# Patient Record
Sex: Female | Born: 1970 | Race: White | Hispanic: No | Marital: Married | Smoking: Former smoker
Health system: Southern US, Community
[De-identification: ages and names within clinical notes are randomized; demographics above are authoritative.]

## PROBLEM LIST (undated history)

## (undated) DIAGNOSIS — G43829 Menstrual migraine, not intractable, without status migrainosus: Secondary | ICD-10-CM

## (undated) DIAGNOSIS — R7989 Other specified abnormal findings of blood chemistry: Secondary | ICD-10-CM

## (undated) DIAGNOSIS — N979 Female infertility, unspecified: Secondary | ICD-10-CM

## (undated) DIAGNOSIS — B029 Zoster without complications: Secondary | ICD-10-CM

## (undated) DIAGNOSIS — M8430XA Stress fracture, unspecified site, initial encounter for fracture: Secondary | ICD-10-CM

## (undated) DIAGNOSIS — Q998 Other specified chromosome abnormalities: Secondary | ICD-10-CM

## (undated) DIAGNOSIS — Q9989 Other specified chromosome abnormalities: Secondary | ICD-10-CM

## (undated) HISTORY — DX: Female infertility, unspecified: N97.9

## (undated) HISTORY — DX: Stress fracture, unspecified site, initial encounter for fracture: M84.30XA

## (undated) HISTORY — DX: Other specified abnormal findings of blood chemistry: R79.89

## (undated) HISTORY — DX: Other specified chromosome abnormalities: Q99.8

## (undated) HISTORY — DX: Menstrual migraine, not intractable, without status migrainosus: G43.829

## (undated) HISTORY — DX: Zoster without complications: B02.9

## (undated) HISTORY — DX: Other specified chromosome abnormalities: Q99.89

## (undated) HISTORY — PX: OTHER SURGICAL HISTORY: SHX169

---
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
## 1998-10-02 ENCOUNTER — Encounter: Admission: RE | Admit: 1998-10-02 | Discharge: 1998-12-31 | Payer: Self-pay | Admitting: Family Medicine

## 1999-03-19 ENCOUNTER — Other Ambulatory Visit: Admission: RE | Admit: 1999-03-19 | Discharge: 1999-03-19 | Payer: Self-pay | Admitting: Obstetrics and Gynecology

## 2000-03-20 ENCOUNTER — Other Ambulatory Visit: Admission: RE | Admit: 2000-03-20 | Discharge: 2000-03-20 | Payer: Self-pay | Admitting: Obstetrics and Gynecology

## 2001-03-26 ENCOUNTER — Other Ambulatory Visit: Admission: RE | Admit: 2001-03-26 | Discharge: 2001-03-26 | Payer: Self-pay | Admitting: Obstetrics and Gynecology

## 2005-04-29 ENCOUNTER — Other Ambulatory Visit: Admission: RE | Admit: 2005-04-29 | Discharge: 2005-04-29 | Payer: Self-pay | Admitting: Obstetrics and Gynecology

## 2008-03-14 ENCOUNTER — Encounter (INDEPENDENT_AMBULATORY_CARE_PROVIDER_SITE_OTHER): Payer: Self-pay | Admitting: Obstetrics and Gynecology

## 2008-03-14 ENCOUNTER — Ambulatory Visit (HOSPITAL_COMMUNITY): Admission: RE | Admit: 2008-03-14 | Discharge: 2008-03-14 | Payer: Self-pay | Admitting: Obstetrics and Gynecology

## 2009-05-22 ENCOUNTER — Ambulatory Visit (HOSPITAL_COMMUNITY): Admission: RE | Admit: 2009-05-22 | Discharge: 2009-05-22 | Payer: Self-pay | Admitting: Obstetrics and Gynecology

## 2009-05-26 ENCOUNTER — Ambulatory Visit (HOSPITAL_COMMUNITY): Admission: RE | Admit: 2009-05-26 | Discharge: 2009-05-26 | Payer: Self-pay | Admitting: Obstetrics & Gynecology

## 2009-10-27 ENCOUNTER — Ambulatory Visit (HOSPITAL_COMMUNITY): Admission: RE | Admit: 2009-10-27 | Discharge: 2009-10-27 | Payer: Self-pay | Admitting: Family Medicine

## 2010-04-25 ENCOUNTER — Encounter: Payer: Self-pay | Admitting: Orthopedic Surgery

## 2010-05-12 ENCOUNTER — Other Ambulatory Visit: Payer: Self-pay | Admitting: Dermatology

## 2010-06-23 LAB — CBC
HCT: 36.4 % (ref 36.0–46.0)
MCHC: 34.1 g/dL (ref 30.0–36.0)
Platelets: 218 10*3/uL (ref 150–400)
RBC: 4 MIL/uL (ref 3.87–5.11)
RDW: 12.6 % (ref 11.5–15.5)

## 2010-08-17 NOTE — Op Note (Signed)
NAME:  Sarah Lloyd, Sarah Lloyd          ACCOUNT NO.:  [id number]   MEDICAL RECORD NO.:  [id number]          PATIENT TYPE:  AMB   LOCATION:  SDC                           FACILITY:  [facility]   PHYSICIAN:  Randye Lobo, M.D.   DATE OF BIRTH:  1970/11/22   DATE OF PROCEDURE:  03/14/2008  DATE OF DISCHARGE:                               OPERATIVE REPORT   PREOPERATIVE DIAGNOSIS:  Missed abortion.   POSTOPERATIVE DIAGNOSIS:  Missed abortion.   PROCEDURE:  Dilation and evacuation.   SURGEON:  Randye Lobo, MD   ANESTHESIA:  MAC, paracervical block with 1% lidocaine.   IV FLUIDS:  900 mL Ringer's lactate.   ESTIMATED BLOOD LOSS:  Minimal.   URINE OUTPUT:  175 mL.   COMPLICATIONS:  None.   INDICATION FOR THE PROCEDURE:  The patient is a 40 year old gravida 1  para 39 Caucasian female with the last menstrual period January 10, 2008,  who presents for a dilation and evacuation procedure for missed  abortion.  The patient had an ultrasound on March 05, 2008,  documenting a 6-plus 5-week gestational intrauterine sac with evidence  of a yolk sac and no fetal pole.  There was a small subchorionic  hemorrhage visible.  A repeat ultrasound on March 12, 2008, documented  a gestational sac measuring 8+3 weeks, still with no fetal pole present.  There was also the presence of a 3.1-cm simple right ovarian cyst.  The  patient has been given a diagnosis of a missed abortion and a plan is  now made to proceed with a dilation and evacuation procedure after  risks, benefits, and alternatives are reviewed.   FINDINGS:  Examination under anesthesia revealed an 8-week-size  anteverted uterus, no adnexal masses were noted.   SPECIMENS:  Products of conception were sent to Pathology.   PROCEDURE:  The patient was reidentified in the preoperative hold area.  She received Ancef 1 g IV for antibiotic prophylaxis.   In the operating room, a MAC anesthetic was induced, and the patient was  placed in the  dorsal lithotomy position.  The lower abdomen, vagina, and  perineum were then sterilely prepped and draped, and the urine was  emptied from the bladder by a red rubber catheter.   An examination under the anesthesia was performed.  A speculum was  placed inside the vagina and a single-tooth tenaculum was placed on the  anterior cervical lip.  Paracervical block was performed with a total of  10 mL of 1% lidocaine.   The uterus was sounded to 8 cm.  The cervix was then dilated to a #25  Pratt dilator.  A #8 suction tip curette was then introduced through the  cervical os to the level of the uterine fundus and was withdrawn  slightly.  Proper suction was applied and then the catheter tip was  turned in a clockwise fashion as it was removed from the intrauterine  cavity.  This was repeated an additional two times and a moderate amount  of products of conception were obtained.  A sharp curette was then used  to palpate the uterine cavity and gently  curette.  There was no evidence  of additional products of conception.  The suction tip curette was  introduced into the uterine cavity one final time and again proper  suction applied and the suction tip curette removed any remaining clots  present.   The products of conception were sent to Pathology.  A single-tooth  tenaculum was removed from the anterior cervical lip.  Hemostasis was  satisfactory.  The speculum was removed.   The patient was awakened and cleansed of any remaining Betadine.  She  was escorted to the recovery room in stable and awake condition.  There  were no complications to the procedure.  All needle, instrument, and  sponge counts were correct.      Randye Lobo, M.D.  Electronically Signed     BES/MEDQ  D:  03/14/2008  T:  03/14/2008  Job:  [id number]

## 2010-11-29 ENCOUNTER — Other Ambulatory Visit: Payer: Self-pay | Admitting: Obstetrics and Gynecology

## 2011-01-07 LAB — URINALYSIS, ROUTINE W REFLEX MICROSCOPIC
Bilirubin Urine: NEGATIVE
Glucose, UA: NEGATIVE mg/dL
Specific Gravity, Urine: 1.01 (ref 1.005–1.030)
pH: 7 (ref 5.0–8.0)

## 2011-01-07 LAB — CBC
HCT: 39 % (ref 36.0–46.0)
MCV: 90.7 fL (ref 78.0–100.0)

## 2011-01-07 LAB — URINE MICROSCOPIC-ADD ON

## 2011-04-05 HISTORY — PX: OTHER SURGICAL HISTORY: SHX169

## 2011-06-13 ENCOUNTER — Ambulatory Visit: Payer: BC Managed Care – PPO

## 2011-06-13 ENCOUNTER — Ambulatory Visit (INDEPENDENT_AMBULATORY_CARE_PROVIDER_SITE_OTHER): Payer: BC Managed Care – PPO | Admitting: Family Medicine

## 2011-06-13 VITALS — BP 126/84 | HR 66 | Temp 98.3°F | Resp 18 | Ht 68.0 in | Wt 174.0 lb

## 2011-06-13 DIAGNOSIS — M79673 Pain in unspecified foot: Secondary | ICD-10-CM

## 2011-06-13 DIAGNOSIS — M79609 Pain in unspecified limb: Secondary | ICD-10-CM

## 2011-06-13 NOTE — Patient Instructions (Addendum)
Rest foot. Wear comfortable shoes. If pain persists will reassess.  A radiologist will read the films and if he sees anything differently we will let you know.

## 2011-06-13 NOTE — Progress Notes (Signed)
Subjective: Patient would like off yesterday and she said she put her shoes on in a hurry. After that she felt pain in the lateral aspect of the right foot. He continued through the day she played her round of golf. It still hurts today. No specific injury.  Objective: He is tender in the lateral aspect of the right foot. Range of motion is normal. Pedal pulses are adequate.  Assessment: Foot pain  Plan: Right foot pain, etiology unclear. The x-ray was normal.  [facility] reading (PRIMARY) by  Dr. Alwyn Ren X-ray negative.

## 2011-12-29 ENCOUNTER — Other Ambulatory Visit: Payer: Self-pay | Admitting: Obstetrics and Gynecology

## 2012-05-23 ENCOUNTER — Encounter: Payer: Self-pay | Admitting: Internal Medicine

## 2012-05-23 NOTE — Telephone Encounter (Signed)
ERROR

## 2012-11-26 ENCOUNTER — Encounter: Payer: Self-pay | Admitting: Obstetrics and Gynecology

## 2012-11-26 ENCOUNTER — Ambulatory Visit (INDEPENDENT_AMBULATORY_CARE_PROVIDER_SITE_OTHER): Payer: BC Managed Care – PPO | Admitting: Obstetrics and Gynecology

## 2012-11-26 VITALS — BP 120/70 | HR 62 | Ht 68.5 in | Wt 159.5 lb

## 2012-11-26 DIAGNOSIS — N6310 Unspecified lump in the right breast, unspecified quadrant: Secondary | ICD-10-CM

## 2012-11-26 DIAGNOSIS — Z0000 Encounter for general adult medical examination without abnormal findings: Secondary | ICD-10-CM | POA: Insufficient documentation

## 2012-11-26 DIAGNOSIS — Z30011 Encounter for initial prescription of contraceptive pills: Secondary | ICD-10-CM

## 2012-11-26 DIAGNOSIS — Z01419 Encounter for gynecological examination (general) (routine) without abnormal findings: Secondary | ICD-10-CM | POA: Insufficient documentation

## 2012-11-26 DIAGNOSIS — Z3009 Encounter for other general counseling and advice on contraception: Secondary | ICD-10-CM

## 2012-11-26 DIAGNOSIS — N63 Unspecified lump in unspecified breast: Secondary | ICD-10-CM

## 2012-11-26 LAB — POCT URINALYSIS DIPSTICK
Bilirubin, UA: NEGATIVE
Blood, UA: NEGATIVE
Glucose, UA: NEGATIVE
Leukocytes, UA: NEGATIVE

## 2012-11-26 MED ORDER — NORETHINDRONE 0.35 MG PO TABS: 1.0000 | ORAL_TABLET | Freq: Every day | ORAL | Status: DC

## 2012-11-26 NOTE — Progress Notes (Signed)
Patient ID: Sarah Lloyd, female   DOB: Feb 18, 1971, 42 y.o.   MRN: [id number] GYNECOLOGY VISIT  PCP: Dr. Annell Greening, [facility]  Referring provider:   HPI: 42 y.o.   Married  Caucasian  female   [id number] with Patient's last menstrual period was 11/03/2012.   here for  AEX.  Planning to adopt a baby from [place].   Labs today:    Urine:  Neg  GYNECOLOGIC HISTORY: Patient's last menstrual period was 11/03/2012. Menses:  flow is light, occasional migraine with aura during menses. Bleeding:  Light flow Sexually active:  yes Partner preference: female Contraception:  condoms everytime Hormone therapy: no DES exposure:  denies Blood transfusions:  none Sexually transmitted diseases:  The patient denies history of sexually transmitted disease. Previous GYN Procedures:  egg retrieval for implantation - [facility] Last mammogram:  Never   Last pap:  normal Date: 12/29/11 History of abnormal pap smear:  no   OB History   Grav Para Term Preterm Abortions TAB SAB Ect Mult Living   2    2 2     0       LIFESTYLE: Exercise:   jogging            Tobacco: no Alcohol: 3 glasses of wine per week Drug use:  no  OTHER HEALTH MAINTENANCE: Last tetanus/TDap: greater than 10 years Gardisil: never Last Influenza:  12/2011 through work(receives free) Zostavax:  never  Last bone density: never Last colonoscopy: never  Last cholesterol check: 2012/2013 wnl with PCP  Family History  Problem Relation Age of Onset  . Heart attack Father   . Hypertension Father   . Cancer Maternal Grandfather     colon cancer  . Diabetes Maternal Grandmother     diet controlled  . Thyroid disease Mother     hypothyroid    There are no active problems to display for this patient.   Past Medical History  Diagnosis Date  . Infertility, female   . Migraine, menstrual     Past Surgical History  Procedure Laterality Date  . Appendectomy  1986  . Dilatation and curettage  2010,  2011  . Pregenetic diagnostic  testing  2013  . Ovarian egg retrieval  2013    ALLERGIES: Review of patient's allergies indicates no known allergies.  Current Outpatient Prescriptions  Medication Sig Dispense Refill  . Multiple Vitamin (MULTIVITAMIN) capsule Take 1 capsule by mouth daily.      Marland Kitchen aspirin 81 MG tablet Take 81 mg by mouth daily.      Marland Kitchen dexamethasone (DECADRON) 2 MG tablet Take 2 mg by mouth 2 (two) times daily with a meal.       No current facility-administered medications for this visit.     ROS:  Pertinent items are noted in HPI.  SOCIAL HISTORY:  Married.  Physical therapy assistant.    PHYSICAL EXAMINATION:    BP 120/70  Pulse 62  Ht 5' 8.5" (1.74 m)  Wt 159 lb 8 oz (72.349 kg)  BMI 23.9 kg/m2  LMP 11/03/2012   Wt Readings from Last 3 Encounters:  11/26/12 159 lb 8 oz (72.349 kg)  06/13/11 174 lb (78.926 kg)     Ht Readings from Last 3 Encounters:  11/26/12 5' 8.5" (1.74 m)  06/13/11 5\' 8"  (1.727 m)    General appearance: alert, cooperative and appears stated age Head: Normocephalic, without obvious abnormality, atraumatic Neck: no adenopathy, supple, symmetrical, trachea midline and thyroid not enlarged, symmetric, no tenderness/mass/nodules Lungs:  clear to auscultation bilaterally Breasts: Inspection negative, No nipple retraction or dimpling, No nipple discharge or bleeding, No axillary or supraclavicular adenopathy, Right breast with 4 mm, firm, mobile mass at 12 o'clock, 2 cm above the areolar margin. Normal to palpation without dominant masses on the left breast. Heart: regular rate and rhythm Abdomen: soft, non-tender;  no masses,  no organomegaly Extremities: extremities normal, atraumatic, no cyanosis or edema Skin: Skin color, texture, turgor normal. No rashes or lesions Lymph nodes: Cervical, supraclavicular, and axillary nodes normal. No abnormal inguinal nodes palpated Neurologic: Grossly normal   Pelvic: External genitalia:  no  lesions              Urethra:  normal appearing urethra with no masses, tenderness or lesions              Bartholins and Skenes: normal                 Vagina: normal appearing vagina with normal color and discharge, no lesions              Cervix: normal appearance                  Bimanual Exam:  Uterus:  uterus is normal size, shape, consistency and nontender                                      Adnexa: normal adnexa in size, nontender and no masses                                      Rectovaginal: Confirms                                      Anus:  normal sphincter tone, no lesions  ASSESSMENT  Right breast mass.  PLAN  Mammogram and right breast ultrasound.  If mammogram is normal, can start progesterone only OCPs.  Discussed proper use and risks and benefits. Pap smear and high risk HPV testing in two months. Return in two months for pap and pelvic and recheck on Progesterone only OCPs   An After Visit Summary was printed and given to the patient.

## 2012-11-26 NOTE — Patient Instructions (Addendum)
PLEASE WAIT UNTIL THE MAMMOGRAM IS DONE AND NORMAL BEFORE STARTING THESE BIRTH CONTROL PILLS.  Norethindrone tablets (contraception) What is this medicine? NORETHINDRONE is an oral contraceptive. The product contains a female hormone known as a progestin. It is used to prevent pregnancy. This medicine may be used for other purposes; ask your health care provider or pharmacist if you have questions. What should I tell my health care provider before I take this medicine? They need to know if you have any of these conditions: -blood vessel disease or blood clots -breast, cervical, or vaginal cancer -diabetes -heart disease -kidney disease -liver disease -mental depression -migraine -seizures -stroke -vaginal bleeding -an unusual or allergic reaction to norethindrone, other medicines, foods, dyes, or preservatives -pregnant or trying to get pregnant -breast-feeding How should I use this medicine? Take this medicine by mouth with a glass of water. You may take it with or without food. Follow the directions on the prescription label. Take this medicine at the same time each day and in the order directed on the package. Do not take your medicine more often than directed. Contact your pediatrician regarding the use of this medicine in children. Special care may be needed. This medicine has been used in female children who have started having menstrual periods. A patient package insert for the product will be given with each prescription and refill. Read this sheet carefully each time. The sheet may change frequently. Overdosage: If you think you have taken too much of this medicine contact a poison control center or emergency room at once. NOTE: This medicine is only for you. Do not share this medicine with others. What if I miss a dose? Try not to miss a dose. Every time you miss a dose or take a dose late your chance of pregnancy increases. When 1 pill is missed (even if only 3 hours late),  take the missed pill as soon as possible and continue taking a pill each day at the regular time (use a back up method of birth control for the next 48 hours). If more than 1 dose is missed, use an additional birth control method for the rest of your pill pack until menses occurs. Contact your health care professional if more than 1 dose has been missed. What may interact with this medicine? Do not take this medicine with any of the following medications: -amprenavir or fosamprenavir -bosentan This medicine may also interact with the following medications: -antibiotics or medicines for infections, especially rifampin, rifabutin, rifapentine, and griseofulvin, and possibly penicillins or tetracyclines -aprepitant -barbiturate medicines, such as phenobarbital -carbamazepine -felbamate -modafinil -oxcarbazepine -phenytoin -ritonavir or other medicines for HIV infection or AIDS -St. John's wort -topiramate This list may not describe all possible interactions. Give your health care provider a list of all the medicines, herbs, non-prescription drugs, or dietary supplements you use. Also tell them if you smoke, drink alcohol, or use illegal drugs. Some items may interact with your medicine. What should I watch for while using this medicine? Visit your doctor or health care professional for regular checks on your progress. You will need a regular breast and pelvic exam and Pap smear while on this medicine. Use an additional method of birth control during the first cycle that you take these tablets. If you have any reason to think you are pregnant, stop taking this medicine right away and contact your doctor or health care professional. If you are taking this medicine for hormone related problems, it may take several cycles of use  to see improvement in your condition. This medicine does not protect you against HIV infection (AIDS) or any other sexually transmitted diseases. What side effects may I  notice from receiving this medicine? Side effects that you should report to your doctor or health care professional as soon as possible: -breast tenderness or discharge -pain in the abdomen, chest, groin or leg -severe headache -skin rash, itching, or hives -sudden shortness of breath -unusually weak or tired -vision or speech problems -yellowing of skin or eyes Side effects that usually do not require medical attention (report to your doctor or health care professional if they continue or are bothersome): -changes in sexual desire -change in menstrual flow -facial hair growth -fluid retention and swelling -headache -irritability -nausea -weight gain or loss This list may not describe all possible side effects. Call your doctor for medical advice about side effects. You may report side effects to FDA at 1-800-FDA-1088. Where should I keep my medicine? Keep out of the reach of children. Store at room temperature between 15 and 30 degrees C (59 and 86 degrees F). Throw away any unused medicine after the expiration date. NOTE: This sheet is a summary. It may not cover all possible information. If you have questions about this medicine, talk to your doctor, pharmacist, or health care provider.  2012, [employer] Standard. (03/06/2008 1:54:05 PM)

## 2012-11-29 ENCOUNTER — Encounter: Payer: Self-pay | Admitting: Obstetrics and Gynecology

## 2012-12-06 ENCOUNTER — Ambulatory Visit
Admission: RE | Admit: 2012-12-06 | Discharge: 2012-12-06 | Disposition: A | Discharge status: Home / Self Care | Payer: BC Managed Care – PPO | Source: Ambulatory Visit | Attending: Obstetrics and Gynecology | Admitting: Obstetrics and Gynecology

## 2012-12-06 DIAGNOSIS — N6310 Unspecified lump in the right breast, unspecified quadrant: Secondary | ICD-10-CM

## 2012-12-11 ENCOUNTER — Encounter: Payer: Self-pay | Admitting: Obstetrics and Gynecology

## 2012-12-13 ENCOUNTER — Other Ambulatory Visit: Payer: Self-pay

## 2013-01-21 ENCOUNTER — Telehealth: Payer: Self-pay | Admitting: Obstetrics and Gynecology

## 2013-01-21 ENCOUNTER — Ambulatory Visit: Payer: Self-pay | Admitting: Obstetrics and Gynecology

## 2013-01-21 NOTE — Telephone Encounter (Signed)
Pt started her pills on the wrong day and wondering if she should stop taking them right now.

## 2013-01-21 NOTE — Telephone Encounter (Signed)
Patient is on progesterone only OCP.  They are the same dosage of norethindrone every day.    If patient skipped a pill at any point, she is not protected against pregnancy until she takes them consecutively for a month.    No worries if the patient just started on the wrong day in the pack.  The pills are all the same!  Thanks,  [employer]

## 2013-01-21 NOTE — Telephone Encounter (Signed)
Spoke with pt who is on POP. Pt states she took her pills correctly last month. This month, she started her cycle on 01-19-13 (Sat.) but didn't use the sticker to change the days across the top of the pack. Pt thinks she started the pack mid week. Pt is in grocery store and will call me back when she is at home and can look at the pill pack.

## 2013-01-21 NOTE — Telephone Encounter (Signed)
Pt called back to report she finished her first pack on Thursday the 16th, and started the second pack the very next day on Friday the 17th. Pt did not begin that pack with the first pill in the pack however, she took the first one labeled "Friday", which was the 6th pill in the pack. Pt advises that the pills are the same color, but they have different numbers above them, as though they could be slightly different dosages per pill. Pt isn't sure what to do next. Can she go back and take the first 5 pills, one each day, to "catch up" to where she should be in the pack? Or should she start a new pack?

## 2013-01-21 NOTE — Telephone Encounter (Signed)
Spoke with pt to advise the pills are all the same dosage, so she can take the first pills in the pack, one each day, to get caught up, and then continue the rest of the pack. Pt has not missed or skipped any pills. Pt appreciative.

## 2013-01-24 ENCOUNTER — Other Ambulatory Visit: Payer: Self-pay | Admitting: Dermatology

## 2013-02-04 ENCOUNTER — Encounter: Payer: Self-pay | Admitting: Obstetrics and Gynecology

## 2013-02-04 ENCOUNTER — Ambulatory Visit (INDEPENDENT_AMBULATORY_CARE_PROVIDER_SITE_OTHER): Payer: BC Managed Care – PPO | Admitting: Obstetrics and Gynecology

## 2013-02-04 VITALS — BP 110/80 | HR 60 | Ht 68.5 in | Wt 156.0 lb

## 2013-02-04 DIAGNOSIS — Z124 Encounter for screening for malignant neoplasm of cervix: Secondary | ICD-10-CM

## 2013-02-04 DIAGNOSIS — N926 Irregular menstruation, unspecified: Secondary | ICD-10-CM

## 2013-02-04 DIAGNOSIS — N6310 Unspecified lump in the right breast, unspecified quadrant: Secondary | ICD-10-CM

## 2013-02-04 DIAGNOSIS — N63 Unspecified lump in unspecified breast: Secondary | ICD-10-CM

## 2013-02-04 NOTE — Progress Notes (Signed)
Patient ID: Sarah Lloyd, female   DOB: 1970-09-02, 42 y.o.   MRN: [id number]  Subjective  LMP 01/19/13. Patient bleeding irregularly on Ortho Micronor.  On for 2 months now.  Bled 10/18 - 10/21, then brown spotting, and red again today. Taking on time.  Not sexually active currently.  Started Micronor for contraception for history of migraine with aura during menses.   Had a bilateral mammogram and ultrasound for the right breast lump and had a normal evaluation.  Patient no longer palpates the right breast lump.  No pap or pelvic done with annual exam as patient was on her cycle.    Objective  Breast exam - no dominant masses, retractions, nipple discharge, or axillary adenopathy.   Pelvic - normal external genitalia and urethra.  Cervix and vagina no lesions.  Uterus small and nontender.  No adnexal masses.   Pap and high risk HPV taken.   Assessment  Irregular bleeding on progesterone only OCPs. Not sexually active.  Right breast lump resolved and normal diagnostic evaluation.  Plan  Continue with Micronor for one more month.  If irregular bleeding persists, will re-evaluate.   Continue with monthly self breast exams. Pap and high risk HPV testing done today.  Annual exam in one year.  Mammogram in one year.   15 minute visit with over 50% spent in counseling.   After visit summary to patient.

## 2013-02-04 NOTE — Patient Instructions (Signed)
Please call if your irregular menses continues during your next pack of progesterone birth control pills.

## 2013-02-05 NOTE — Addendum Note (Signed)
Addended by: Conley Simmonds on: 02/05/2013 01:02 PM   Modules accepted: Orders

## 2013-02-07 ENCOUNTER — Other Ambulatory Visit: Payer: Self-pay

## 2013-07-09 ENCOUNTER — Ambulatory Visit: Payer: BC Managed Care – PPO | Admitting: Professional

## 2013-07-16 ENCOUNTER — Ambulatory Visit (INDEPENDENT_AMBULATORY_CARE_PROVIDER_SITE_OTHER): Payer: BC Managed Care – PPO | Admitting: Professional

## 2013-07-16 DIAGNOSIS — Z0289 Encounter for other administrative examinations: Secondary | ICD-10-CM

## 2013-07-30 ENCOUNTER — Ambulatory Visit (INDEPENDENT_AMBULATORY_CARE_PROVIDER_SITE_OTHER): Payer: BLUE CROSS/BLUE SHIELD | Admitting: Professional

## 2013-07-30 DIAGNOSIS — Z0289 Encounter for other administrative examinations: Secondary | ICD-10-CM

## 2013-11-06 ENCOUNTER — Other Ambulatory Visit: Payer: Self-pay

## 2013-11-06 DIAGNOSIS — Z1231 Encounter for screening mammogram for malignant neoplasm of breast: Secondary | ICD-10-CM

## 2013-11-17 ENCOUNTER — Other Ambulatory Visit: Payer: Self-pay | Admitting: Obstetrics and Gynecology

## 2013-11-18 NOTE — Telephone Encounter (Signed)
Patient has AEX scheduled on 02/15/14//kn

## 2013-12-05 ENCOUNTER — Other Ambulatory Visit: Payer: Self-pay | Admitting: Dermatology

## 2013-12-06 ENCOUNTER — Encounter: Payer: Self-pay | Admitting: Obstetrics and Gynecology

## 2013-12-10 ENCOUNTER — Ambulatory Visit
Admission: RE | Admit: 2013-12-10 | Discharge: 2013-12-10 | Disposition: A | Discharge status: Home / Self Care | Payer: BC Managed Care – PPO | Source: Ambulatory Visit

## 2013-12-10 DIAGNOSIS — Z1231 Encounter for screening mammogram for malignant neoplasm of breast: Secondary | ICD-10-CM

## 2014-02-03 ENCOUNTER — Encounter: Payer: Self-pay | Admitting: Obstetrics and Gynecology

## 2014-02-05 ENCOUNTER — Ambulatory Visit (INDEPENDENT_AMBULATORY_CARE_PROVIDER_SITE_OTHER): Payer: BC Managed Care – PPO | Admitting: Obstetrics and Gynecology

## 2014-02-05 ENCOUNTER — Ambulatory Visit: Payer: BC Managed Care – PPO | Admitting: Obstetrics and Gynecology

## 2014-02-05 ENCOUNTER — Encounter: Payer: Self-pay | Admitting: Obstetrics and Gynecology

## 2014-02-05 VITALS — BP 120/80 | HR 66 | Resp 16 | Ht 68.0 in | Wt 177.6 lb

## 2014-02-05 DIAGNOSIS — Z01419 Encounter for gynecological examination (general) (routine) without abnormal findings: Secondary | ICD-10-CM

## 2014-02-05 NOTE — Progress Notes (Signed)
43 y.o. [id number] MarriedCaucasianF here for annual exam.   Cycles irregular for the last few months.  In August had 2 cycles per month:  August 1 and then August 26. September 7 and 18.  October 8 and 24.  Took a couple of late pills but did not result in irregular bleeding at the time.  Still had irregular cycles when she did not miss pills.   Sarah Lloyd reduces migraines.  Patient wants to avoid estrogen.  Not sexually active since dilation and curettage several years ago. History of chromosomal translocation and recurrent miscarriage.  Husband is worried about pregnancy and another miscarriage occurring.  States that she and husband are OK with no sexual activity.   Planning on adoption.   Patient's last menstrual period was 01/25/2014.          Sexually active: Yes.    The current method of family planning is Micronor. Exercising: Yes.    Home exercise routine includes jogging about 3 or 4 times a week. Smoker:  no  Health Maintenance: Pap:  02/05/13 HR HPV neg History of abnormal Pap:  no MMG:  12/10/13 Bi-Rads Neg  Colonoscopy:  never BMD:   never TDaP:  07/2013 through pcp Screening Labs: not today, Hb today: not today, Urine today: neg    reports that she has quit smoking. She does not have any smokeless tobacco history on file. She reports that she drinks alcohol. She reports that she does not use illicit drugs.  Past Medical History  Diagnosis Date  . Infertility, female   . Migraine, menstrual   . 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 Appendectomy  1986  . Dilatation and curettage  2010, 2011  . Pregenetic diagnostic  testing  2013  . Ovarian egg retrieval  2013    Current Outpatient Prescriptions  Medication Sig Dispense Refill  . CAMILA 0.35 MG tablet TAKE 1 TABLET EVERY DAY 28 tablet 2  . Multiple Vitamin (MULTIVITAMIN) capsule Take 1 capsule by mouth daily.     No  current facility-administered medications for this visit.    Family History  Problem Relation Age of Onset  . Heart attack Father   . Hypertension Father   . Cancer Maternal Grandfather     colon cancer  . Diabetes Maternal Grandmother     diet controlled  . Thyroid disease Mother     hypothyroid    ROS:  Pertinent items are noted in HPI.  Otherwise, a comprehensive ROS was negative.  Exam:   BP 120/80 mmHg  Pulse 66  Resp 16  Ht 5\' 8"  (1.727 m)  Wt 177 lb 9.6 oz (80.559 kg)  BMI 27.01 kg/m2  LMP 01/25/2014     Height: 5\' 8"  (172.7 cm)  Ht Readings from Last 3 Encounters:  02/05/14 5\' 8"  (1.727 m)  02/04/13 5' 8.5" (1.74 m)  11/26/12 5' 8.5" (1.74 m)    General appearance: alert, cooperative and appears stated age Head: Normocephalic, without obvious abnormality, atraumatic Neck: no adenopathy, supple, symmetrical, trachea midline and thyroid normal to inspection and palpation Lungs: clear to auscultation bilaterally Breasts: normal appearance, no masses or tenderness, Inspection negative, No nipple retraction or dimpling, No nipple discharge or bleeding No axillary nodes palpable. Heart: regular rate and rhythm Abdomen: soft, non-tender; bowel sounds normal; no masses,  no organomegaly Extremities: extremities normal, atraumatic, no cyanosis or edema Skin: Skin color, texture, turgor normal. No  rashes or lesions Lymph nodes: Cervical, supraclavicular, and axillary nodes normal. No abnormal inguinal nodes palpated Neurologic: Grossly normal   Pelvic: External genitalia:  no lesions              Urethra:  normal appearing urethra with no masses, tenderness or lesions              Bartholins and Skenes: normal                 Vagina: normal appearing vagina with normal color and discharge, no lesions              Cervix: no lesions              Pap taken: No. Bimanual Exam:  Uterus:  normal and normal size, contour, position, consistency, mobility, non-tender               Adnexa: normal adnexa and no mass, fullness, tenderness               Rectovaginal: Confirms               Anus:  normal sphincter tone, no lesions  A:  Well Woman with normal exam Break through bleeding on Micronor.  Migraine headaches.   P:   Mammogram yearly. pap smear not indicated. Will stop Sarah Lloyd and monitor cycles and headaches.  If headaches recur, will send to [facility].  return annually or prn  An After Visit Summary was printed and given to the patient.

## 2014-02-05 NOTE — Patient Instructions (Signed)

## 2014-02-08 ENCOUNTER — Other Ambulatory Visit: Payer: Self-pay | Admitting: Obstetrics and Gynecology

## 2014-11-19 ENCOUNTER — Other Ambulatory Visit: Payer: Self-pay

## 2014-11-19 DIAGNOSIS — Z1231 Encounter for screening mammogram for malignant neoplasm of breast: Secondary | ICD-10-CM

## 2014-12-31 ENCOUNTER — Ambulatory Visit
Admission: RE | Admit: 2014-12-31 | Discharge: 2014-12-31 | Disposition: A | Discharge status: Home / Self Care | Payer: BLUE CROSS/BLUE SHIELD | Source: Ambulatory Visit

## 2014-12-31 DIAGNOSIS — Z1231 Encounter for screening mammogram for malignant neoplasm of breast: Secondary | ICD-10-CM

## 2015-01-02 ENCOUNTER — Other Ambulatory Visit: Payer: Self-pay | Admitting: Obstetrics and Gynecology

## 2015-01-02 DIAGNOSIS — R928 Other abnormal and inconclusive findings on diagnostic imaging of breast: Secondary | ICD-10-CM

## 2015-01-13 ENCOUNTER — Ambulatory Visit
Admission: RE | Admit: 2015-01-13 | Discharge: 2015-01-13 | Disposition: A | Discharge status: Home / Self Care | Payer: BLUE CROSS/BLUE SHIELD | Source: Ambulatory Visit | Attending: Obstetrics and Gynecology | Admitting: Obstetrics and Gynecology

## 2015-01-13 DIAGNOSIS — R928 Other abnormal and inconclusive findings on diagnostic imaging of breast: Secondary | ICD-10-CM

## 2015-02-13 ENCOUNTER — Encounter: Payer: Self-pay | Admitting: Obstetrics and Gynecology

## 2015-02-13 ENCOUNTER — Ambulatory Visit (INDEPENDENT_AMBULATORY_CARE_PROVIDER_SITE_OTHER): Payer: BLUE CROSS/BLUE SHIELD | Admitting: Obstetrics and Gynecology

## 2015-02-13 VITALS — BP 130/82 | HR 60 | Resp 12 | Ht 68.5 in | Wt 175.6 lb

## 2015-02-13 DIAGNOSIS — Z0000 Encounter for general adult medical examination without abnormal findings: Secondary | ICD-10-CM

## 2015-02-13 DIAGNOSIS — Z01419 Encounter for gynecological examination (general) (routine) without abnormal findings: Secondary | ICD-10-CM

## 2015-02-13 LAB — POCT URINALYSIS DIPSTICK
BILIRUBIN UA: NEGATIVE
GLUCOSE UA: NEGATIVE
Ketones, UA: NEGATIVE
LEUKOCYTES UA: NEGATIVE
NITRITE UA: NEGATIVE
Protein, UA: NEGATIVE
UROBILINOGEN UA: NEGATIVE
pH, UA: 5

## 2015-02-13 NOTE — Patient Instructions (Signed)

## 2015-02-13 NOTE — Progress Notes (Signed)
Patient ID: Sarah Lloyd, female   DOB: 05/04/1970, 44 y.o.   MRN: [id number] 44 y.o. G53P0020 Married Caucasian female here for annual exam.    No more migraines since off [place]. Menses are not painful or heavy.   Enjoys going to the beach.   Adopting a child from [place].   PCP:   London Pepper, MD  Patient's last menstrual period was 02/02/2015 (exact date).          Sexually active: No. female The current method of family planning is none.    Exercising: Yes.    running and crossfit.  Did a 1/2 marathon this year in [place]. Smoker:  no  Health Maintenance: Pap:  02-05-13 Neg:Neg HR HPV History of abnormal Pap:  no MMG:  12-31-14 3D Density Cat.C/BiRads3--see Epic Colonoscopy:  n/a BMD:   n/a  Result  n/a TDaP:  2015 with PCP Screening Labs:  Hb today: PCP, Urine today: Trace RBCs--asymptomatic   reports that she has quit smoking. She does not have any smokeless tobacco history on file. She reports that she drinks about 2.4 oz of alcohol per week. She reports that she does not use illicit drugs.  Past Medical History  Diagnosis Date  . Infertility, female   . Migraine, menstrual   . 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 Appendectomy  1986  . Dilatation and curettage  2010, 2011  . Pregenetic diagnostic  testing  2013  . Ovarian egg retrieval  2013    Current Outpatient Prescriptions  Medication Sig Dispense Refill  . Multiple Vitamin (MULTIVITAMIN) capsule Take 1 capsule by mouth daily.     No current facility-administered medications for this visit.    Family History  Problem Relation Age of Onset  . Heart attack Father   . Hypertension Father   . Cancer Maternal Grandfather     colon cancer  . Diabetes Maternal Grandmother     diet controlled  . Thyroid disease Mother     hypothyroid    ROS:  Pertinent items are noted in HPI.  Otherwise, a comprehensive ROS was  negative.  Exam:   BP 130/82 mmHg  Pulse 60  Resp 12  Ht 5' 8.5" (1.74 m)  Wt 175 lb 9.6 oz (79.652 kg)  BMI 26.31 kg/m2  LMP 02/02/2015 (Exact Date)    General appearance: alert, cooperative and appears stated age Head: Normocephalic, without obvious abnormality, atraumatic Neck: no adenopathy, supple, symmetrical, trachea midline and thyroid normal to inspection and palpation Lungs: clear to auscultation bilaterally Breasts: normal appearance, no masses or tenderness, Inspection negative, No nipple retraction or dimpling, No nipple discharge or bleeding, No axillary or supraclavicular adenopathy Heart: regular rate and rhythm Abdomen: soft, non-tender; bowel sounds normal; no masses,  no organomegaly Extremities: extremities normal, atraumatic, no cyanosis or edema Skin: Skin color, texture, turgor normal. No rashes or lesions Lymph nodes: Cervical, supraclavicular, and axillary nodes normal. No abnormal inguinal nodes palpated Neurologic: Grossly normal  Pelvic: External genitalia:  no lesions              Urethra:  normal appearing urethra with no masses, tenderness or lesions              Bartholins and Skenes: normal                 Vagina: normal appearing vagina with normal color and discharge, no lesions  Cervix: no lesions              Pap taken: No. Bimanual Exam:  Uterus:  normal size, contour, position, consistency, mobility, non-tender              Adnexa: normal adnexa and no mass, fullness, tenderness              Rectovaginal: Yes.  .  Confirms.              Anus:  normal sphincter tone,  Hemorrhoids noted.  Chaperone was present for exam.  Assessment:   Well woman visit with normal exam. BI-RADS 3 mammogram. Microscopic hematuria.  Asymptomatic.  No work up needed. Chromosomal translation.   Plan: Yearly mammogram recommended after age 34.  Diagnostic mammogram and ultrasound of right breast due in April 2017.  Recommended self breast exam.   Pap and HR HPV as above. Discussed Calcium, Vitamin D, regular exercise program including cardiovascular and weight bearing exercise. Labs performed.  No..     Refills given on medications.  No..    Best wishes for a successful adoption.  Follow up annually and prn.      After visit summary provided.

## 2015-06-16 ENCOUNTER — Other Ambulatory Visit: Payer: Self-pay | Admitting: Obstetrics and Gynecology

## 2015-06-16 DIAGNOSIS — N6489 Other specified disorders of breast: Secondary | ICD-10-CM

## 2015-07-15 ENCOUNTER — Ambulatory Visit
Admission: RE | Admit: 2015-07-15 | Discharge: 2015-07-15 | Disposition: A | Discharge status: Home / Self Care | Payer: BLUE CROSS/BLUE SHIELD | Source: Ambulatory Visit | Attending: Obstetrics and Gynecology | Admitting: Obstetrics and Gynecology

## 2015-07-15 DIAGNOSIS — N6489 Other specified disorders of breast: Secondary | ICD-10-CM

## 2015-10-28 ENCOUNTER — Ambulatory Visit (INDEPENDENT_AMBULATORY_CARE_PROVIDER_SITE_OTHER): Payer: Worker's Compensation | Admitting: Family Medicine

## 2015-10-28 ENCOUNTER — Encounter: Payer: Self-pay | Admitting: Family Medicine

## 2015-10-28 VITALS — BP 110/70 | HR 61 | Temp 98.1°F | Resp 16 | Ht 68.0 in | Wt 178.4 lb

## 2015-10-28 DIAGNOSIS — M545 Low back pain, unspecified: Secondary | ICD-10-CM

## 2015-10-28 DIAGNOSIS — M25562 Pain in left knee: Secondary | ICD-10-CM

## 2015-10-28 DIAGNOSIS — M25532 Pain in left wrist: Secondary | ICD-10-CM

## 2015-10-28 NOTE — Progress Notes (Signed)
   HPI  Patient presents today here for workers comp eval after a fall today at work.  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 She had pain afterward in her L lower back, L wrist, and L knee.   She feels that she is improving some already and her wrist pain is nearly resolved.   L knee pain is dull achy persistent pain in the lower anterior portion of the knee  Her L low back pain is achy persistent pain worse with movement. NO leg symptoms associated. Originates in the L low back and radiates outward.   Her L wrist hurts with full flexion but oitherwise is not bothering her.   PMH: Smoking status noted Past medical, surgical, social, and family Hx reviewed and updated in EMR ROS: Per HPI  Objective: BP 110/70 (BP Location: Right Arm, Patient Position: Sitting, Cuff Size: Normal)   Pulse 61   Temp 98.1 F (36.7 C) (Oral)   Resp 16   Ht 5\' 8"  (1.727 m)   Wt 178 lb 6.4 oz (80.9 kg)   LMP 10/21/2015 (Exact Date)   SpO2 97%   BMI 27.13 kg/m  Gen: NAD, alert, cooperative with exam HEENT: NCAT CV: RRR, good S1/S2, no murmur Resp: CTABL, no wheezes, non-labored Ext: No edema, warm Neuro: Alert and oriented, No gross deficits  MSK:  MSK: L knee without erythema, effusion, bruising, or gross deformity Small abrasion on Lateral anterior surface, approx 2 cm X 3 cm and very shallow- no bleeding or signs of bleeding No joint line tenderness.  ligamentously intact to Lachman's and with varus and valgus stress.  Negative McMurray's test  L low back without tenderness to palp of lumbar spine or lumbar paras[pinal muscles Full ROM without pain  L wrist  No gross deformity Full ROM No swelling or bony tenderness to palp   Assessment and plan:  # L low back pain  No sciatica, no red flags Discussed supportive care and NSAIDs for pain.    # L wrist pain No concern for fracture, resolving Monitor  # L knee pain Exam reassuring, mild abrasion on anterior surface without bleeding Supportive care + NSAIDs as needed  Follow up in 2 weeks, discussed supportive care and OTC NSAIDs for pain.  2-4 week sofllow up for hopeful release   No work restrictions.    Kenn File, MD 4:56 PM

## 2015-10-28 NOTE — Patient Instructions (Signed)
Great to meet you!  For pain try ice for the first 24 hours, then Ice or heat (whichever feels better) for 15 minutes a few times a day as needed  For pain medications 2 tylenol up to 3 times daily +/- 800 mg of ibprofen (4 regular tablets- look carefully at the mg) up to 3 times a day OR 2 aleve up to twice a day.   Come back in 2-4 weeks for follow up

## 2015-12-14 ENCOUNTER — Other Ambulatory Visit: Payer: Self-pay | Admitting: Obstetrics and Gynecology

## 2015-12-14 DIAGNOSIS — N63 Unspecified lump in unspecified breast: Secondary | ICD-10-CM

## 2016-01-01 ENCOUNTER — Ambulatory Visit
Admission: RE | Admit: 2016-01-01 | Discharge: 2016-01-01 | Disposition: A | Discharge status: Home / Self Care | Payer: BLUE CROSS/BLUE SHIELD | Source: Ambulatory Visit | Attending: Obstetrics and Gynecology | Admitting: Obstetrics and Gynecology

## 2016-01-01 DIAGNOSIS — N63 Unspecified lump in unspecified breast: Secondary | ICD-10-CM

## 2016-03-10 NOTE — Progress Notes (Signed)
45 y.o. G38P0020 Married Caucasian female here for annual exam.    Menses no problem.  Does labs at work and through PCP.  Cholesterol at work was [employer].  HDL was slightly low.  Lost 8 pounds intentionally.   Planning to adopt from [place].   PCP:   London Pepper, MD  Patient's last menstrual period was 02/26/2016 (exact date).     Period Cycle (Days): 30 Period Duration (Days): 4 Period Pattern: Regular Menstrual Flow: Moderate Menstrual Control: Tampon, Maxi pad Menstrual Control Change Freq (Hours): every 6-8 hours Dysmenorrhea: (!) Mild Dysmenorrhea Symptoms: Cramping     Sexually active: No. female The current method of family planning is none.    Exercising: Yes.    Running and strength Smoker:  no  Health Maintenance: Pap:  02-05-13 Neg:Neg HR HPV History of abnormal Pap:  no MMG:  01-01-16 Density C/Neg/asymmetry previously noted is not evident/BiRads1/screening 1 year:TBC Colonoscopy:  n/a BMD:   n/a  Result  n/a TDaP:  2015 Gardasil:   N/A HIV: neg in pregnancy.   Screening Labs:  Hb today: PCP, Urine today: Neg   reports that she has quit smoking. She has never used smokeless tobacco. She reports that she drinks about 2.4 oz of alcohol per week . She reports that she does not use drugs.  Past Medical History:  Diagnosis Date  . 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 Infertility, female   . Migraine, menstrual     Past Surgical History:  Procedure Laterality Date  . APPENDECTOMY  1986  . dilatation and curettage  2010, 2011  . ovarian egg retrieval  2013  . pregenetic diagnostic  testing  2013    Current Outpatient Prescriptions  Medication Sig Dispense Refill  . Multiple Vitamin (MULTIVITAMIN) capsule Take 1 capsule by mouth daily.     No current facility-administered medications for this visit.     Family History  Problem Relation Age of Onset  . Heart attack Father   . Hypertension Father   . Thyroid disease  Mother     hypothyroid  . Cancer Maternal Grandfather     colon cancer  . Diabetes Maternal Grandmother     diet controlled    ROS:  Pertinent items are noted in HPI.  Otherwise, a comprehensive ROS was negative.  Exam:   BP 108/66 (BP Location: Right Arm, Patient Position: Sitting, Cuff Size: Normal)   Pulse 70   Resp 14   Ht 5\' 8"  (1.727 m)   Wt 172 lb 9.6 oz (78.3 kg)   LMP 02/26/2016 (Exact Date)   BMI 26.24 kg/m     General appearance: alert, cooperative and appears stated age Head: Normocephalic, without obvious abnormality, atraumatic Neck: no adenopathy, supple, symmetrical, trachea midline and thyroid normal to inspection and palpation Lungs: clear to auscultation bilaterally Breasts: normal appearance, no masses or tenderness, No nipple retraction or dimpling, No nipple discharge or bleeding, No axillary or supraclavicular adenopathy Heart: regular rate and rhythm Abdomen: soft, non-tender; no masses, no organomegaly Extremities: extremities normal, atraumatic, no cyanosis or edema Skin: Skin color, texture, turgor normal. No rashes or lesions Lymph nodes: Cervical, supraclavicular, and axillary nodes normal. No abnormal inguinal nodes palpated Neurologic: Grossly normal  Pelvic: External genitalia:  no lesions              Urethra:  normal appearing urethra with no masses, tenderness or lesions  Bartholins and Skenes: normal                 Vagina: normal appearing vagina with normal color and discharge, no lesions              Cervix: no lesions              Pap taken: Yes.   Bimanual Exam:  Uterus:  normal size, contour, position, consistency, mobility, non-tender              Adnexa: no mass, fullness, tenderness              Rectal exam: Yes.  .  Confirms.              Anus:  normal sphincter tone, no lesions  Chaperone was present for exam.  Assessment:   Well woman visit with normal exam.   Plan: Yearly mammogram recommended after age 62.   Recommended self breast exam.  Pap and HR HPV as above. Discussed Calcium, Vitamin D, regular exercise program including cardiovascular and weight bearing exercise.   Follow up annually and prn.       After visit summary provided.

## 2016-03-11 ENCOUNTER — Encounter: Payer: Self-pay | Admitting: Obstetrics and Gynecology

## 2016-03-11 ENCOUNTER — Ambulatory Visit (INDEPENDENT_AMBULATORY_CARE_PROVIDER_SITE_OTHER): Payer: BLUE CROSS/BLUE SHIELD | Admitting: Obstetrics and Gynecology

## 2016-03-11 VITALS — BP 108/66 | HR 70 | Resp 14 | Ht 68.0 in | Wt 172.6 lb

## 2016-03-11 DIAGNOSIS — Z01419 Encounter for gynecological examination (general) (routine) without abnormal findings: Secondary | ICD-10-CM

## 2016-03-11 DIAGNOSIS — Z0000 Encounter for general adult medical examination without abnormal findings: Secondary | ICD-10-CM

## 2016-03-11 LAB — POCT URINALYSIS DIPSTICK
Bilirubin, UA: NEGATIVE
Blood, UA: NEGATIVE
Glucose, UA: NEGATIVE
KETONES UA: NEGATIVE
LEUKOCYTES UA: NEGATIVE
NITRITE UA: NEGATIVE
PH UA: 6
PROTEIN UA: NEGATIVE
UROBILINOGEN UA: NEGATIVE

## 2016-03-11 NOTE — Patient Instructions (Signed)

## 2016-03-18 LAB — IPS PAP TEST WITH HPV

## 2016-12-20 ENCOUNTER — Other Ambulatory Visit: Payer: Self-pay | Admitting: Obstetrics and Gynecology

## 2016-12-20 DIAGNOSIS — Z1231 Encounter for screening mammogram for malignant neoplasm of breast: Secondary | ICD-10-CM

## 2017-01-03 ENCOUNTER — Ambulatory Visit
Admission: RE | Admit: 2017-01-03 | Discharge: 2017-01-03 | Disposition: A | Discharge status: Home / Self Care | Payer: BLUE CROSS/BLUE SHIELD | Source: Ambulatory Visit | Attending: Obstetrics and Gynecology | Admitting: Obstetrics and Gynecology

## 2017-01-03 DIAGNOSIS — Z1231 Encounter for screening mammogram for malignant neoplasm of breast: Secondary | ICD-10-CM

## 2017-03-22 ENCOUNTER — Ambulatory Visit: Payer: Self-pay | Admitting: Obstetrics and Gynecology

## 2017-05-31 ENCOUNTER — Ambulatory Visit: Payer: BLUE CROSS/BLUE SHIELD | Admitting: Obstetrics and Gynecology

## 2017-05-31 ENCOUNTER — Encounter: Payer: Self-pay | Admitting: Obstetrics and Gynecology

## 2017-05-31 ENCOUNTER — Other Ambulatory Visit: Payer: Self-pay

## 2017-05-31 VITALS — BP 118/76 | HR 68 | Resp 16 | Ht 67.75 in | Wt 178.0 lb

## 2017-05-31 DIAGNOSIS — Z01419 Encounter for gynecological examination (general) (routine) without abnormal findings: Secondary | ICD-10-CM

## 2017-05-31 NOTE — Progress Notes (Signed)
47 y.o. G29P0020 Married Caucasian female here for annual exam.    Abdominal pain and bloating.  Thinks it is menstrual.  Occasional heavy menses.   Doing adoption domestically.  PCP:  Dr. Leia Alf   Patient's last menstrual period was 05/12/2017.           Sexually active: Yes.   -- occasional The current method of family planning is condoms always.    Exercising: Yes.    jogging Smoker:  no  Health Maintenance: Pap:  03/11/16 Pap and HR HPV negative History of abnormal Pap:  no MMG:  01/03/17 BIRADS 1 negative/density d TDaP:  2015 Gardasil:   no HIV: negative in pregnancy Hep C: never Screening Labs: PCP   reports that she has quit smoking. she has never used smokeless tobacco. She reports that she drinks about 2.4 oz of alcohol per week. She reports that she does not use drugs.  Past Medical History:  Diagnosis Date  . 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 Infertility, female   . Migraine, menstrual     Past Surgical History:  Procedure Laterality Date  . APPENDECTOMY  1986  . dilatation and curettage  2010, 2011  . ovarian egg retrieval  2013  . pregenetic diagnostic  testing  2013    Current Outpatient Medications  Medication Sig Dispense Refill  . Multiple Vitamin (MULTIVITAMIN) capsule Take 1 capsule by mouth daily.     No current facility-administered medications for this visit.     Family History  Problem Relation Age of Onset  . Heart attack Father   . Hypertension Father   . Thyroid disease Mother        hypothyroid  . Cancer Maternal Grandfather        colon cancer  . Diabetes Maternal Grandmother        diet controlled    ROS:  Pertinent items are noted in HPI.  Otherwise, a comprehensive ROS was negative.  Exam:   BP 118/76 (BP Location: Right Arm, Patient Position: Sitting, Cuff Size: Normal)   Pulse 68   Resp 16   Ht 5' 7.75" (1.721 m)   Wt 178 lb (80.7 kg)   LMP 05/12/2017   BMI 27.26 kg/m      General appearance: alert, cooperative and appears stated age Head: Normocephalic, without obvious abnormality, atraumatic Neck: no adenopathy, supple, symmetrical, trachea midline and thyroid normal to inspection and palpation Lungs: clear to auscultation bilaterally Breasts: normal appearance, no masses or tenderness, No nipple retraction or dimpling, No nipple discharge or bleeding, No axillary or supraclavicular adenopathy Heart: regular rate and rhythm Abdomen: soft, non-tender; no masses, no organomegaly Extremities: extremities normal, atraumatic, no cyanosis or edema Skin: Skin color, texture, turgor normal. No rashes or lesions Lymph nodes: Cervical, supraclavicular, and axillary nodes normal. No abnormal inguinal nodes palpated Neurologic: Grossly normal  Pelvic: External genitalia:  no lesions              Urethra:  normal appearing urethra with no masses, tenderness or lesions              Bartholins and Skenes: normal                 Vagina: normal appearing vagina with normal color and discharge, no lesions              Cervix: no lesions              Pap taken: No.  Bimanual Exam:  Uterus:  normal size, contour, position, consistency, mobility, non-tender              Adnexa: no mass, fullness, tenderness              Rectal exam: Yes.  .  Confirms.              Anus:  normal sphincter tone, no lesions  Chaperone was present for exam.  Assessment:   Well woman visit with normal exam.   Plan: Mammogram screening discussed. Recommended self breast awareness. Pap and HR HPV as above. Guidelines for Calcium, Vitamin D, regular exercise program including cardiovascular and weight bearing exercise. Labs with PCP. Follow up annually and prn.    After visit summary provided.

## 2017-05-31 NOTE — Patient Instructions (Signed)

## 2017-08-31 DIAGNOSIS — R000 Tachycardia, unspecified: Secondary | ICD-10-CM | POA: Diagnosis not present

## 2017-12-01 DIAGNOSIS — M25511 Pain in right shoulder: Secondary | ICD-10-CM | POA: Diagnosis not present

## 2017-12-06 DIAGNOSIS — M24811 Other specific joint derangements of right shoulder, not elsewhere classified: Secondary | ICD-10-CM | POA: Diagnosis not present

## 2018-01-11 ENCOUNTER — Telehealth: Payer: Self-pay | Admitting: Obstetrics and Gynecology

## 2018-01-11 DIAGNOSIS — Z0000 Encounter for general adult medical examination without abnormal findings: Secondary | ICD-10-CM | POA: Diagnosis not present

## 2018-01-11 NOTE — Telephone Encounter (Signed)
Routing to Dr. Quincy Simmonds to review and advise for patient request.

## 2018-01-11 NOTE — Telephone Encounter (Signed)
I would check to see if the [facility] breast feeding specialty nurses have experience with this.  I have not prescribed medication for this in the past.  There are delivery systems available to attach to the breast so the baby can feed from a tube attached to the breast.

## 2018-01-11 NOTE — Telephone Encounter (Signed)
Patient left voicemail over lunch stating that they just received news that they will be adopting a baby. The due date is 06/06/18. Patient is calling regarding advise on preparing her body to breastfeed.

## 2018-01-12 NOTE — Telephone Encounter (Signed)
Left detailed message, ok per dpr. Georgiann Mohs from [facility] outpatient lactation department will be contacting her directly on Monday, 01/15/18, to schedule an appointment for lactation consult. Should you have any additional questions, return call to office at [phone].   Encounter closed.

## 2018-01-12 NOTE — Telephone Encounter (Signed)
Called to [facility] [phone] and left message with lactation nurse to return my call.  Patient may need to have consult appointment with lactation nurse.  Message left to patient to return call to clarify if she wants to have consult with lactation nurse to discuss her options, attempt to induce lactation with medication, pumping and SNS (supplemental nursing system).

## 2018-01-12 NOTE — Telephone Encounter (Signed)
Spoke with Chrys Racer, [facility] lactation consultant. She will have Gatha Mayer, with [facility] outpatient lactation department contact patient on Monday, 10/14 to schedule an appointment. Advised I will return call to patient to provide update.

## 2018-01-15 ENCOUNTER — Telehealth (HOSPITAL_COMMUNITY): Payer: Self-pay | Admitting: Lactation Services

## 2018-01-15 NOTE — Telephone Encounter (Signed)
Called and spoke with [facility] in regards to adopting an infant in early March. She is interested in inducing Lactation . She has not been pregnant previously.   Reviewed pumping and /or hand expressing starting as soon as possible with latching infant once born. Briefly discussed the Virgel Bouquet Method for inducing Lactation and referred mom to Dr. Jacqulyn Cane in [place] who specializes in Lactation Induction.   Enc mom to call back with further questions/concerns as needed.

## 2018-01-16 ENCOUNTER — Telehealth (HOSPITAL_COMMUNITY): Payer: Self-pay | Admitting: Lactation Services

## 2018-01-16 NOTE — Telephone Encounter (Signed)
Called mom back after speaking with colleugues about inducing Lactation. Enc mom to research the [employer]. Gave mom websites such as [employer], themilkmeg.com, and canadian breastfeeding HuntLaws.ca. Reviewed with mom that Domperidone is not available in the [place].   Discussed with mom that obtaining a good pump and starting pumping as soon as possible 8 x a day for 15 minutes at a time. Reviewed renting hospital grade pump to start and she would need to also purchase the pump kit. Reviewed calling insurance to see if they will provide a DEBP for mom also. Reviewed hands free pumping and pumping in the car as she travels for her job. Enc mom to call back to be shown how to use her pump if she is ready to pump.   Mom to do some research and to call back when ready to set up the pump. Mom reports she has been pregnant twice and lost both infants. She reports she has a genetic condition causing her difficulty with carrying infants. Reviewed supply and demand and the role hormones play on making milk. Mom reports she has severe headaches with BCP's. Reviewed the use of the SNS at the breast after infant is born as an option also.   Mom to call back with any questions/concerns as needed.

## 2018-01-17 ENCOUNTER — Other Ambulatory Visit: Payer: Self-pay | Admitting: Obstetrics and Gynecology

## 2018-01-17 DIAGNOSIS — Z1231 Encounter for screening mammogram for malignant neoplasm of breast: Secondary | ICD-10-CM

## 2018-01-18 DIAGNOSIS — Z136 Encounter for screening for cardiovascular disorders: Secondary | ICD-10-CM | POA: Diagnosis not present

## 2018-01-18 DIAGNOSIS — Z131 Encounter for screening for diabetes mellitus: Secondary | ICD-10-CM | POA: Diagnosis not present

## 2018-01-18 DIAGNOSIS — Z0000 Encounter for general adult medical examination without abnormal findings: Secondary | ICD-10-CM | POA: Diagnosis not present

## 2018-02-06 ENCOUNTER — Ambulatory Visit
Admission: RE | Admit: 2018-02-06 | Discharge: 2018-02-06 | Disposition: A | Discharge status: Home / Self Care | Payer: 59 | Source: Ambulatory Visit | Attending: Obstetrics and Gynecology | Admitting: Obstetrics and Gynecology

## 2018-02-06 DIAGNOSIS — Z1231 Encounter for screening mammogram for malignant neoplasm of breast: Secondary | ICD-10-CM

## 2018-05-23 ENCOUNTER — Telehealth: Payer: Self-pay | Admitting: Obstetrics and Gynecology

## 2018-05-23 NOTE — Telephone Encounter (Signed)
Left message regarding upcoming appointment has been canceled and needs to be rescheduled. °

## 2018-06-18 ENCOUNTER — Ambulatory Visit: Payer: BLUE CROSS/BLUE SHIELD | Admitting: Obstetrics and Gynecology

## 2018-06-19 ENCOUNTER — Ambulatory Visit: Payer: 59 | Admitting: Obstetrics and Gynecology

## 2018-08-20 ENCOUNTER — Other Ambulatory Visit: Payer: Self-pay

## 2018-08-21 ENCOUNTER — Ambulatory Visit: Payer: 59 | Admitting: Obstetrics and Gynecology

## 2018-08-22 ENCOUNTER — Telehealth: Payer: Self-pay | Admitting: Obstetrics and Gynecology

## 2018-08-22 ENCOUNTER — Other Ambulatory Visit: Payer: Self-pay

## 2018-08-22 ENCOUNTER — Ambulatory Visit (INDEPENDENT_AMBULATORY_CARE_PROVIDER_SITE_OTHER): Payer: 59 | Admitting: Obstetrics and Gynecology

## 2018-08-22 ENCOUNTER — Encounter: Payer: Self-pay | Admitting: Obstetrics and Gynecology

## 2018-08-22 VITALS — BP 114/76 | HR 72 | Temp 97.4°F | Resp 14 | Ht 68.0 in | Wt 174.0 lb

## 2018-08-22 DIAGNOSIS — Z1211 Encounter for screening for malignant neoplasm of colon: Secondary | ICD-10-CM | POA: Diagnosis not present

## 2018-08-22 DIAGNOSIS — N6320 Unspecified lump in the left breast, unspecified quadrant: Secondary | ICD-10-CM | POA: Diagnosis not present

## 2018-08-22 DIAGNOSIS — Z01419 Encounter for gynecological examination (general) (routine) without abnormal findings: Secondary | ICD-10-CM

## 2018-08-22 NOTE — Progress Notes (Signed)
48 y.o. G35P0020 Married Caucasian female here for annual exam.    Adopted a boy, Caden.  All is going well.  Returning back to work soon.   No menstrual problems.  Occurring every 21 - 25 days.  Some menses are heavier than others.  Not seeking any birth control at this time.   PCP: London Pepper, MD   Patient's last menstrual period was 08/13/2018.           Sexually active: No.  The current method of family planning is abstinence.  Condoms when active.  Exercising: Yes.    cardio and strength Smoker:  no  Health Maintenance: Pap:  03/11/16 Neg:Neg HR HPV History of abnormal Pap:  no MMG:  02/06/18 BIRADS 1 negative/density d Colonoscopy:n/a   BMD:   n/a TDaP:  2015 Gardasil:   no HIV: negative in pregnancy Hep C: never Screening Labs: PCP   reports that she has quit smoking. She has never used smokeless tobacco. She reports current alcohol use of about 4.0 standard drinks of alcohol per week. She reports that she does not use drugs.  Past Medical History:  Diagnosis Date  . 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 Infertility, female   . Migraine, menstrual     Past Surgical History:  Procedure Laterality Date  . APPENDECTOMY  1986  . dilatation and curettage  2010, 2011  . ovarian egg retrieval  2013  . pregenetic diagnostic  testing  2013    Current Outpatient Medications  Medication Sig Dispense Refill  . Multiple Vitamin (MULTIVITAMIN) capsule Take 1 capsule by mouth daily.     No current facility-administered medications for this visit.     Family History  Problem Relation Age of Onset  . Heart attack Father   . Hypertension Father   . Thyroid disease Mother        hypothyroid  . Cancer Maternal Grandfather        colon cancer  . Diabetes Maternal Grandmother        diet controlled  . Breast cancer Neg Hx     Review of Systems  Constitutional: Negative.   HENT: Negative.   Eyes: Negative.   Respiratory:  Negative.   Cardiovascular: Negative.   Gastrointestinal: Negative.   Endocrine: Negative.   Genitourinary: Negative.   Musculoskeletal: Negative.   Skin: Negative.   Allergic/Immunologic: Negative.   Neurological: Negative.   Hematological: Negative.   Psychiatric/Behavioral: Negative.     Exam:   BP 114/76 (BP Location: Left Arm, Patient Position: Sitting, Cuff Size: Normal)   Pulse 72   Temp (!) 97.4 F (36.3 C) (Temporal)   Resp 14   Ht 5\' 8"  (1.727 m)   Wt 174 lb (78.9 kg)   LMP 08/13/2018   BMI 26.46 kg/m     General appearance: alert, cooperative and appears stated age Head: Normocephalic, without obvious abnormality, atraumatic Neck: no adenopathy, supple, symmetrical, trachea midline and thyroid normal to inspection and palpation Lungs: clear to auscultation bilaterally Breasts: right - normal appearance, no masses or tenderness, No nipple retraction or dimpling, No nipple discharge or bleeding, No axillary or supraclavicular adenopathy Left - 1 cm mass at 8:00, nontender.  No nipple retraction or dimpling, No nipple discharge or bleeding, No axillary or supraclavicular adenopathy Heart: regular rate and rhythm Abdomen: soft, non-tender; no masses, no organomegaly Extremities: extremities normal, atraumatic, no cyanosis or edema Skin: Skin color, texture, turgor normal. No rashes or lesions Lymph nodes: Cervical,  supraclavicular, and axillary nodes normal. No abnormal inguinal nodes palpated Neurologic: Grossly normal  Pelvic: External genitalia:  no lesions              Urethra:  normal appearing urethra with no masses, tenderness or lesions              Bartholins and Skenes: normal                 Vagina: normal appearing vagina with normal color and discharge, no lesions              Cervix: no lesions              Pap taken: No. Bimanual Exam:  Uterus:  normal size, contour, position, consistency, mobility, non-tender              Adnexa: no mass, fullness,  tenderness              Rectal exam: Yes.  .  Confirms.              Anus:  normal sphincter tone, no lesions  Chaperone was present for exam.  Assessment:   Well woman visit with normal exam. Left breast mass.   Plan: Mammogram dx and [place] left breast.  Recommended self breast awareness. Pap and HR HPV as above. Guidelines for Calcium, Vitamin D, regular exercise program including cardiovascular and weight bearing exercise. Labs with PCP.  IFOB. Follow up annually and prn.     After visit summary provided.

## 2018-08-22 NOTE — Telephone Encounter (Signed)
Please schedule a diagnostic left mammogram and left breast US.   Mass noted at 8:00 position measuring about 1 cm.   Patient goes to the [facility].

## 2018-08-22 NOTE — Patient Instructions (Signed)

## 2018-08-23 NOTE — Telephone Encounter (Signed)
Spoke with Clarise Cruz at [facility]. Left breast DX MMG and [place], if needed, scheduled for 5/29 at 2pm, arrive at 1:40pm.

## 2018-08-23 NOTE — Telephone Encounter (Signed)
Spoke with patient, advised of appt details as seen below. Patient verbalizes understanding and is agreeable to date and time.   Routing to provider for final review. Patient is agreeable to disposition. Will close encounter.

## 2018-08-31 ENCOUNTER — Ambulatory Visit
Admission: RE | Admit: 2018-08-31 | Discharge: 2018-08-31 | Disposition: A | Discharge status: Home / Self Care | Payer: 59 | Source: Ambulatory Visit | Attending: Obstetrics and Gynecology | Admitting: Obstetrics and Gynecology

## 2018-08-31 ENCOUNTER — Other Ambulatory Visit: Payer: Self-pay

## 2018-08-31 DIAGNOSIS — N6320 Unspecified lump in the left breast, unspecified quadrant: Secondary | ICD-10-CM

## 2018-09-04 ENCOUNTER — Telehealth: Payer: Self-pay | Admitting: *Deleted

## 2018-09-04 LAB — FECAL OCCULT BLOOD, IMMUNOCHEMICAL: Fecal Occult Bld: NEGATIVE

## 2018-09-04 LAB — SPECIMEN STATUS REPORT

## 2018-09-04 NOTE — Telephone Encounter (Signed)
Spoke with patient, advised as seen below per Dr. Quincy Simmonds. OV scheduled for 7/8 at 10am with Dr. Quincy Simmonds. Patient verbalizes understanding and is agreeable.   Encounter closed.

## 2018-09-04 NOTE — Telephone Encounter (Signed)
Notes recorded by Burnice Logan, RN on 09/04/2018 at 9:54 AM EDT Left message to call Sharee Pimple, RN at [facility].

## 2018-09-04 NOTE — Telephone Encounter (Signed)
-----   Message from Nunzio Cobbs, MD sent at 09/03/2018 12:41 PM EDT ----- Madaline Brilliant to remove from mammogram hold and return to routine screening.  She needs a breast recheck in 6 weeks.  Please schedule this.

## 2018-10-10 ENCOUNTER — Other Ambulatory Visit: Payer: Self-pay

## 2018-10-10 ENCOUNTER — Encounter: Payer: Self-pay | Admitting: Obstetrics and Gynecology

## 2018-10-10 ENCOUNTER — Ambulatory Visit (INDEPENDENT_AMBULATORY_CARE_PROVIDER_SITE_OTHER): Payer: 59 | Admitting: Obstetrics and Gynecology

## 2018-10-10 VITALS — BP 118/72 | HR 80 | Temp 97.4°F | Resp 12 | Wt 172.0 lb

## 2018-10-10 DIAGNOSIS — Z87898 Personal history of other specified conditions: Secondary | ICD-10-CM | POA: Diagnosis not present

## 2018-10-10 NOTE — Progress Notes (Signed)
GYNECOLOGY  VISIT   HPI: 48 y.o.   Married  Caucasian  female   G2P0020 with Patient's last menstrual period was 10/10/2018.   here for 6 week breast recheck    At her routine exam on 08/22/18, she had a 1 cm left breast mass at 8:00.   Her imaging showed normal fibroglandular tissue.   GYNECOLOGIC HISTORY: Patient's last menstrual period was 10/10/2018. Contraception:  Condoms Menopausal hormone therapy:  none Last mammogram:  08/31/18 Left Breast Diagnostic MM/US -- BIRADS 1 negative/density c Last pap smear:   03/11/16 Neg:Neg HR HPV        OB History    Gravida  2   Para      Term      Preterm      AB  2   Living  0     SAB      TAB  2   Ectopic      Multiple      Live Births                 Patient Active Problem List   Diagnosis Date Noted  . Routine gynecological examination 11/26/2012    Past Medical History:  Diagnosis Date  . 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 Infertility, female   . Migraine, menstrual     Past Surgical History:  Procedure Laterality Date  . APPENDECTOMY  1986  . dilatation and curettage  2010, 2011  . ovarian egg retrieval  2013  . pregenetic diagnostic  testing  2013    Current Outpatient Medications  Medication Sig Dispense Refill  . Multiple Vitamin (MULTIVITAMIN) capsule Take 1 capsule by mouth daily.    . valACYclovir (VALTREX) 1000 MG tablet Take 1,000 mg by mouth 2 (two) times daily.     No current facility-administered medications for this visit.      ALLERGIES: Patient has no known allergies.  Family History  Problem Relation Age of Onset  . Heart attack Father   . Hypertension Father   . Thyroid disease Mother        hypothyroid  . Cancer Maternal Grandfather        colon cancer  . Diabetes Maternal Grandmother        diet controlled  . Breast cancer Neg Hx     Social History   Socioeconomic History  . Marital status: Married    Spouse name: Not on  file  . Number of children: Not on file  . Years of education: Not on file  . Highest education level: Not on file  Occupational History  . Not on file  Social Needs  . Financial resource strain: Not on file  . Food insecurity    Worry: Not on file    Inability: Not on file  . Transportation needs    Medical: Not on file    Non-medical: Not on file  Tobacco Use  . Smoking status: Former Research scientist (life sciences)  . Smokeless tobacco: Never Used  Substance and Sexual Activity  . Alcohol use: Yes    Alcohol/week: 4.0 standard drinks    Types: 4 Standard drinks or equivalent per week    Comment: 4 drinks per week  . Drug use: No  . Sexual activity: Not Currently    Partners: Male    Birth control/protection: None  Lifestyle  . Physical activity    Days per week: Not on file    Minutes per session: Not on  file  . Stress: Not on file  Relationships  . Social Herbalist on phone: Not on file    Gets together: Not on file    Attends religious service: Not on file    Active member of club or organization: Not on file    Attends meetings of clubs or organizations: Not on file    Relationship status: Not on file  . Intimate partner violence    Fear of current or ex partner: Not on file    Emotionally abused: Not on file    Physically abused: Not on file    Forced sexual activity: Not on file  Other Topics Concern  . Not on file  Social History Narrative  . Not on file    Review of Systems  Constitutional: Negative.   HENT: Negative.   Eyes: Negative.   Respiratory: Negative.   Cardiovascular: Negative.   Gastrointestinal: Negative.   Endocrine: Negative.   Genitourinary: Negative.   Musculoskeletal: Negative.   Skin: Negative.   Allergic/Immunologic: Negative.   Neurological: Negative.   Hematological: Negative.   Psychiatric/Behavioral: Negative.     PHYSICAL EXAMINATION:    BP 118/72 (BP Location: Left Arm, Patient Position: Sitting, Cuff Size: Large)   Pulse 80    Temp (!) 97.4 F (36.3 C) (Temporal)   Resp 12   Wt 172 lb (78 kg)   LMP 10/10/2018   BMI 26.15 kg/m     General appearance: alert, cooperative and appears stated age   Breasts: normal appearance, no masses or tenderness, No nipple retraction or dimpling, No nipple discharge or bleeding, No axillary or supraclavicular adenopathy  Chaperone was present for exam.  ASSESSMENT  Hx left breast mass.  Normal exam today.   PLAN  We discussed management of breast masses and recommendation for breast surgeon evaluation if a mass is palpated and imaging does not explain the mass.  This is not needed for her.  She will do her routine imagining in November, 2020.  Questions invited and answered.  An After Visit Summary was printed and given to the patient.  __15____ minutes face to face time of which over 50% was spent in counseling.

## 2018-11-07 ENCOUNTER — Other Ambulatory Visit: Payer: Self-pay

## 2018-11-07 ENCOUNTER — Encounter: Payer: Self-pay | Admitting: Obstetrics and Gynecology

## 2018-11-07 ENCOUNTER — Ambulatory Visit (INDEPENDENT_AMBULATORY_CARE_PROVIDER_SITE_OTHER): Payer: 59 | Admitting: Obstetrics and Gynecology

## 2018-11-07 VITALS — BP 136/88 | HR 66 | Temp 97.5°F | Ht 68.0 in | Wt 173.0 lb

## 2018-11-07 DIAGNOSIS — N921 Excessive and frequent menstruation with irregular cycle: Secondary | ICD-10-CM | POA: Diagnosis not present

## 2018-11-07 DIAGNOSIS — N926 Irregular menstruation, unspecified: Secondary | ICD-10-CM | POA: Diagnosis not present

## 2018-11-07 LAB — POCT URINE PREGNANCY: Preg Test, Ur: NEGATIVE

## 2018-11-07 MED ORDER — NORETHINDRONE 0.35 MG PO TABS: 1.0000 | ORAL_TABLET | Freq: Every day | ORAL | 2 refills | Status: DC

## 2018-11-07 NOTE — Patient Instructions (Signed)
Sarah Lloyd,   Let me know how these pills are working for you.  Try them for 3 months if you can, to see if they are a good option for you.   Stay safe!   [place]va  Norethindrone tablets (contraception) What is this medicine? NORETHINDRONE (nor eth IN drone) is an oral contraceptive. The product contains a female hormone known as a progestin. It is used to prevent pregnancy. This medicine may be used for other purposes; ask your health care provider or pharmacist if you have questions. COMMON BRAND NAME(S): Camila, Deblitane 28-Day, Errin, Heather, Turley, Jolivette, Blythe, Nor-QD, Nora-BE, Norlyroc, Ortho Micronor, [employer] 28-Day What should I tell my health care provider before I take this medicine? They need to know if you have any of these conditions:  blood vessel disease or blood clots  breast, cervical, or vaginal cancer  diabetes  heart disease  kidney disease  liver disease  mental depression  migraine  seizures  stroke  vaginal bleeding  an unusual or allergic reaction to norethindrone, other medicines, foods, dyes, or preservatives  pregnant or trying to get pregnant  breast-feeding How should I use this medicine? Take this medicine by mouth with a glass of water. You may take it with or without food. Follow the directions on the prescription label. Take this medicine at the same time each day and in the order directed on the package. Do not take your medicine more often than directed. Contact your pediatrician regarding the use of this medicine in children. Special care may be needed. This medicine has been used in female children who have started having menstrual periods. A patient package insert for the product will be given with each prescription and refill. Read this sheet carefully each time. The sheet may change frequently. Overdosage: If you think you have taken too much of this medicine contact a poison control center or emergency room at once. NOTE:  This medicine is only for you. Do not share this medicine with others. What if I miss a dose? Try not to miss a dose. Every time you miss a dose or take a dose late your chance of pregnancy increases. When 1 pill is missed (even if only 3 hours late), take the missed pill as soon as possible and continue taking a pill each day at the regular time (use a back up method of birth control for the next 48 hours). If more than 1 dose is missed, use an additional birth control method for the rest of your pill pack until menses occurs. Contact your health care professional if more than 1 dose has been missed. What may interact with this medicine? Do not take this medicine with any of the following medications:  amprenavir or fosamprenavir  bosentan This medicine may also interact with the following medications:  antibiotics or medicines for infections, especially rifampin, rifabutin, rifapentine, and griseofulvin, and possibly penicillins or tetracyclines  aprepitant  barbiturate medicines, such as phenobarbital  carbamazepine  felbamate  modafinil  oxcarbazepine  phenytoin  ritonavir or other medicines for HIV infection or AIDS  St. John's wort  topiramate This list may not describe all possible interactions. Give your health care provider a list of all the medicines, herbs, non-prescription drugs, or dietary supplements you use. Also tell them if you smoke, drink alcohol, or use illegal drugs. Some items may interact with your medicine. What should I watch for while using this medicine? Visit your doctor or health care professional for regular checks on your progress.  You will need a regular breast and pelvic exam and Pap smear while on this medicine. Use an additional method of birth control during the first cycle that you take these tablets. If you have any reason to think you are pregnant, stop taking this medicine right away and contact your doctor or health care professional. If  you are taking this medicine for hormone related problems, it may take several cycles of use to see improvement in your condition. This medicine does not protect you against HIV infection (AIDS) or any other sexually transmitted diseases. What side effects may I notice from receiving this medicine? Side effects that you should report to your doctor or health care professional as soon as possible:  breast tenderness or discharge  pain in the abdomen, chest, groin or leg  severe headache  skin rash, itching, or hives  sudden shortness of breath  unusually weak or tired  vision or speech problems  yellowing of skin or eyes Side effects that usually do not require medical attention (report to your doctor or health care professional if they continue or are bothersome):  changes in sexual desire  change in menstrual flow  facial hair growth  fluid retention and swelling  headache  irritability  nausea  weight gain or loss This list may not describe all possible side effects. Call your doctor for medical advice about side effects. You may report side effects to FDA at 1-800-FDA-1088. Where should I keep my medicine? Keep out of the reach of children. Store at room temperature between 15 and 30 degrees C (59 and 86 degrees F). Throw away any unused medicine after the expiration date. NOTE: This sheet is a summary. It may not cover all possible information. If you have questions about this medicine, talk to your doctor, pharmacist, or health care provider.  2020 Elsevier/Gold Standard (2011-12-09 16:41:35)

## 2018-11-07 NOTE — Progress Notes (Signed)
GYNECOLOGY  VISIT   HPI: 48 y.o.   Married  Caucasian  female   G2P0020 with Patient's last menstrual period was 11/01/2018 (exact date).   here for heavy cycle and fatigue in July.  Usually her heaviest pad is just one pad a night.  She was using 2 -3 pads per night and could not keep a tampon in.  Felt fatigue and run down. Bleeding is lessening and she is still feeling the after affects of the cycle.  Had cramping.   Her menses last month was more spread out but less flow.   Son is 80 months old. He is sleeping for 5 hours at a time.  Last intercourse was years ago.   GYNECOLOGIC HISTORY: Patient's last menstrual period was 11/01/2018 (exact date). Contraception: condoms Menopausal hormone therapy:  none Last mammogram: 02-06-18 neg/density D/BiRads1-- 08/31/18 Left Breast Diagnostic MM/US -- BIRADS 1 negative/density c Last pap smear: 03-11-16 Neg:Neg HR HPV, 02-05-13 Neg:Neg HR HPV        OB History    Gravida  2   Para      Term      Preterm      AB  2   Living  0     SAB      TAB  2   Ectopic      Multiple      Live Births                 Patient Active Problem List   Diagnosis Date Noted  . Routine gynecological examination 11/26/2012    Past Medical History:  Diagnosis Date  . 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 Infertility, female   . Migraine, menstrual     Past Surgical History:  Procedure Laterality Date  . APPENDECTOMY  1986  . dilatation and curettage  2010, 2011  . ovarian egg retrieval  2013  . pregenetic diagnostic  testing  2013    Current Outpatient Medications  Medication Sig Dispense Refill  . Multiple Vitamin (MULTIVITAMIN) capsule Take 1 capsule by mouth daily.    . valACYclovir (VALTREX) 500 MG tablet Take 500 mg by mouth as needed.     No current facility-administered medications for this visit.      ALLERGIES: Patient has no known allergies.  Family History  Problem  Relation Age of Onset  . Heart attack Father   . Hypertension Father   . Thyroid disease Mother        hypothyroid  . Cancer Maternal Grandfather        colon cancer  . Diabetes Maternal Grandmother        diet controlled  . Breast cancer Neg Hx     Social History   Socioeconomic History  . Marital status: Married    Spouse name: Not on file  . Number of children: Not on file  . Years of education: Not on file  . Highest education level: Not on file  Occupational History  . Not on file  Social Needs  . Financial resource strain: Not on file  . Food insecurity    Worry: Not on file    Inability: Not on file  . Transportation needs    Medical: Not on file    Non-medical: Not on file  Tobacco Use  . Smoking status: Former Research scientist (life sciences)  . Smokeless tobacco: Never Used  Substance and Sexual Activity  . Alcohol use: Yes    Alcohol/week: 4.0 standard  drinks    Types: 4 Standard drinks or equivalent per week    Comment: 4 drinks per week  . Drug use: No  . Sexual activity: Not Currently    Partners: Male    Birth control/protection: None  Lifestyle  . Physical activity    Days per week: Not on file    Minutes per session: Not on file  . Stress: Not on file  Relationships  . Social Herbalist on phone: Not on file    Gets together: Not on file    Attends religious service: Not on file    Active member of club or organization: Not on file    Attends meetings of clubs or organizations: Not on file    Relationship status: Not on file  . Intimate partner violence    Fear of current or ex partner: Not on file    Emotionally abused: Not on file    Physically abused: Not on file    Forced sexual activity: Not on file  Other Topics Concern  . Not on file  Social History Narrative  . Not on file    Review of Systems  All other systems reviewed and are negative.   PHYSICAL EXAMINATION:    BP 136/88   Pulse 66   Temp (!) 97.5 F (36.4 C) (Temporal)   Ht 5'  8" (1.727 m)   Wt 173 lb (78.5 kg)   LMP 11/01/2018 (Exact Date)   BMI 26.30 kg/m     General appearance: alert, cooperative and appears stated age   Pelvic: External genitalia:  no lesions              Urethra:  normal appearing urethra with no masses, tenderness or lesions              Bartholins and Skenes: normal                 Vagina: normal appearing vagina with normal color and discharge, no lesions              Cervix: no lesions.  Small amount of flow.                 Bimanual Exam:  Uterus:  normal size, contour, position, consistency, mobility, non-tender              Adnexa: no mass, fullness, tenderness         Chaperone was present for exam.  ASSESSMENT  Menorrhagia with irregular menses.  Likely anovulatory bleeding.   PLAN  TSH, CBC, iron, ferritin.  We discussed abnormal uterine bleeding.  Will start Micronor. Rx for 1 pack and 2 refills.  Instructed in use.   She will let me know how she is doing with this tx option.  If abnormal bleeding persists, return for pelvic US and EMB.   An After Visit Summary was printed and given to the patient.  __15____ minutes face to face time of which over 50% was spent in counseling.

## 2018-11-08 LAB — CBC
Hematocrit: 39.2 % (ref 34.0–46.6)
Hemoglobin: 12.5 g/dL (ref 11.1–15.9)
MCH: 30 pg (ref 26.6–33.0)
MCHC: 31.9 g/dL (ref 31.5–35.7)
MCV: 94 fL (ref 79–97)
Platelets: 246 10*3/uL (ref 150–450)
RBC: 4.17 x10E6/uL (ref 3.77–5.28)
RDW: 13.8 % (ref 11.7–15.4)
WBC: 7.9 10*3/uL (ref 3.4–10.8)

## 2018-11-08 LAB — FERRITIN: Ferritin: 29 ng/mL (ref 15–150)

## 2018-11-08 LAB — IRON: Iron: 36 ug/dL (ref 27–159)

## 2018-11-08 LAB — TSH: TSH: 3.21 u[IU]/mL (ref 0.450–4.500)

## 2018-12-26 ENCOUNTER — Telehealth: Payer: Self-pay | Admitting: Obstetrics and Gynecology

## 2018-12-26 MED ORDER — NORETHINDRONE 0.35 MG PO TABS: 1.0000 | ORAL_TABLET | Freq: Every day | ORAL | 1 refills | Status: DC

## 2018-12-26 NOTE — Telephone Encounter (Signed)
Spoke with patient, advised Rx has been sent to pharmacy. AEX scheduled for 08/28/18 at 3pm with Dr. Quincy Simmonds.   Encounter closed.

## 2018-12-26 NOTE — Telephone Encounter (Signed)
[place] for refills of Micronor until annual exam is due.

## 2018-12-26 NOTE — Telephone Encounter (Signed)
Patient was told to call with an update after taking her new birth control. She said "I really like this birth control and would like to go ahead with the refills". [facility], [place], [place]. She said no need to return her call unless you have questions.

## 2018-12-26 NOTE — Telephone Encounter (Signed)
Last AEX 08/22/18 No AEX scheduled Last screening MMG 02/06/18: Birad 1, neg Dx Left breast MMG 08/31/18: return to routine screening  Rx pended for Micronor #3pkg/1RF  Routing to Dr. Quincy Simmonds

## 2019-02-07 ENCOUNTER — Other Ambulatory Visit: Payer: Self-pay | Admitting: Obstetrics and Gynecology

## 2019-02-07 DIAGNOSIS — Z1231 Encounter for screening mammogram for malignant neoplasm of breast: Secondary | ICD-10-CM

## 2019-04-03 ENCOUNTER — Other Ambulatory Visit: Payer: Self-pay

## 2019-04-03 ENCOUNTER — Ambulatory Visit
Admission: RE | Admit: 2019-04-03 | Discharge: 2019-04-03 | Disposition: A | Discharge status: Home / Self Care | Payer: 59 | Source: Ambulatory Visit | Attending: Obstetrics and Gynecology | Admitting: Obstetrics and Gynecology

## 2019-04-03 DIAGNOSIS — Z1231 Encounter for screening mammogram for malignant neoplasm of breast: Secondary | ICD-10-CM

## 2019-04-19 ENCOUNTER — Other Ambulatory Visit: Payer: Self-pay

## 2019-04-19 NOTE — Telephone Encounter (Signed)
Medication refill request: norethindrone 0.35mg  Last AEX:  08-22-2018 Next AEX: 08-28-2019 Last MMG (if hormonal medication request): 03/2019 category c density birads 1:neg Refill authorized: last refilled 12-26-18 3 packs with 1 refill to [facility] pharmacy. Refill request just came in for express scripts requesting 90 day supply. Please approve if appropriate.

## 2019-04-22 MED ORDER — NORETHINDRONE 0.35 MG PO TABS: 1.0000 | ORAL_TABLET | Freq: Every day | ORAL | 1 refills | Status: DC

## 2019-07-04 DIAGNOSIS — C801 Malignant (primary) neoplasm, unspecified: Secondary | ICD-10-CM

## 2019-07-04 HISTORY — DX: Malignant (primary) neoplasm, unspecified: C80.1

## 2019-08-28 ENCOUNTER — Encounter: Payer: Self-pay | Admitting: Obstetrics and Gynecology

## 2019-08-28 ENCOUNTER — Other Ambulatory Visit: Payer: Self-pay

## 2019-08-28 ENCOUNTER — Ambulatory Visit: Payer: 59 | Admitting: Obstetrics and Gynecology

## 2019-08-28 VITALS — BP 128/80 | HR 64 | Temp 97.4°F | Resp 16 | Ht 68.0 in | Wt 174.6 lb

## 2019-08-28 DIAGNOSIS — Z01419 Encounter for gynecological examination (general) (routine) without abnormal findings: Secondary | ICD-10-CM

## 2019-08-28 MED ORDER — NORETHINDRONE 0.35 MG PO TABS: 1.0000 | ORAL_TABLET | Freq: Every day | ORAL | 3 refills | Status: DC

## 2019-08-28 NOTE — Patient Instructions (Signed)

## 2019-08-28 NOTE — Progress Notes (Signed)
49 y.o. G41P0020 Married Caucasian female here for annual exam.    Menses are good with Micronor, regular and less intense.  Received her Covid vaccine.   Son is 23 months old.  Working prn.   PCP:  Karena Addison, MD   Patient's last menstrual period was 08/23/2019 (exact date).           Sexually active: No.  The current method of family planning is abstinence/Micronor.    Exercising: No.  she does run a few days/week Smoker:  no  Health Maintenance: Pap: 03/11/16 Neg:Neg HR HPV, 02-05-13 Neg:Neg HR HPV, 12-29-11 Neg History of abnormal Pap:  no MMG: 04-03-19 3D/Neg/density C/BiRads1 Colonoscopy:  n/a BMD:   n/a  Result  n/a TDaP:  2015 Gardasil:   no HIV:Neg in pregnancy Hep C:never Screening Labs:  PCP.    reports that she has quit smoking. She has never used smokeless tobacco. She reports current alcohol use of about 4.0 standard drinks of alcohol per week. She reports that she does not use drugs.  Past Medical History:  Diagnosis Date  . Cancer ([place]) 07/2019   skin cancer removed from back  . 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 Herpes zoster   . Infertility, female   . Migraine, menstrual     Past Surgical History:  Procedure Laterality Date  . APPENDECTOMY  1986  . dilatation and curettage  2010, 2011  . ovarian egg retrieval  2013  . pregenetic diagnostic  testing  2013    Current Outpatient Medications  Medication Sig Dispense Refill  . Multiple Vitamin (MULTIVITAMIN) capsule Take 1 capsule by mouth daily.    . norethindrone (MICRONOR) 0.35 MG tablet Take 1 tablet (0.35 mg total) by mouth daily. 3 Package 3  . valACYclovir (VALTREX) 500 MG tablet Take 500 mg by mouth as needed.     No current facility-administered medications for this visit.    Family History  Problem Relation Age of Onset  . Heart attack Father   . Hypertension Father   . Thyroid disease Mother        hypothyroid  . Cancer Maternal  Grandfather        colon cancer  . Diabetes Maternal Grandmother        diet controlled  . Breast cancer Neg Hx     Review of Systems  All other systems reviewed and are negative.   Exam:   BP 128/80   Pulse 64   Temp (!) 97.4 F (36.3 C) (Temporal)   Resp 16   Ht 5\' 8"  (1.727 m)   Wt 174 lb 9.6 oz (79.2 kg)   LMP 08/23/2019 (Exact Date)   BMI 26.55 kg/m     General appearance: alert, cooperative and appears stated age Head: normocephalic, without obvious abnormality, atraumatic Neck: no adenopathy, supple, symmetrical, trachea midline and thyroid normal to inspection and palpation Lungs: clear to auscultation bilaterally Breasts: normal appearance, no masses or tenderness, No nipple retraction or dimpling, No nipple discharge or bleeding, No axillary adenopathy Heart: regular rate and rhythm Abdomen: soft, non-tender; no masses, no organomegaly Extremities: extremities normal, atraumatic, no cyanosis or edema Skin: skin color, texture, turgor normal. No rashes or lesions Lymph nodes: cervical, supraclavicular, and axillary nodes normal. Neurologic: grossly normal  Pelvic: External genitalia:  no lesions              No abnormal inguinal nodes palpated.  Urethra:  normal appearing urethra with no masses, tenderness or lesions              Bartholins and Skenes: normal                 Vagina: normal appearing vagina with normal color and discharge, no lesions              Cervix: no lesions              Pap taken: No. Bimanual Exam:  Uterus:  normal size, contour, position, consistency, mobility, non-tender              Adnexa: no mass, fullness, tenderness              Rectal exam: Yes.  .  Confirms.              Anus:  normal sphincter tone, no lesions  Chaperone was present for exam.  Assessment:   Well woman visit with normal exam.   Plan: Mammogram screening discussed. Self breast awareness reviewed. Pap and HR HPV as above. Guidelines for  Calcium, Vitamin D, regular exercise program including cardiovascular and weight bearing exercise. Refill of Micronor for one year.  Follow up annually and prn.   After visit summary provided.

## 2020-02-24 ENCOUNTER — Other Ambulatory Visit: Payer: Self-pay | Admitting: Obstetrics and Gynecology

## 2020-02-24 DIAGNOSIS — Z0000 Encounter for general adult medical examination without abnormal findings: Secondary | ICD-10-CM

## 2020-04-08 ENCOUNTER — Ambulatory Visit: Payer: 59

## 2020-04-09 ENCOUNTER — Ambulatory Visit: Payer: 59

## 2020-05-05 ENCOUNTER — Other Ambulatory Visit: Payer: Self-pay

## 2020-05-05 ENCOUNTER — Ambulatory Visit
Admission: RE | Admit: 2020-05-05 | Discharge: 2020-05-05 | Disposition: A | Discharge status: Home / Self Care | Payer: 59 | Source: Ambulatory Visit | Attending: Obstetrics and Gynecology | Admitting: Obstetrics and Gynecology

## 2020-05-05 DIAGNOSIS — Z0000 Encounter for general adult medical examination without abnormal findings: Secondary | ICD-10-CM

## 2020-06-18 ENCOUNTER — Other Ambulatory Visit: Payer: Self-pay

## 2020-06-18 ENCOUNTER — Ambulatory Visit
Admission: RE | Admit: 2020-06-18 | Discharge: 2020-06-18 | Disposition: A | Discharge status: Home / Self Care | Payer: 59 | Source: Ambulatory Visit | Attending: Obstetrics and Gynecology | Admitting: Obstetrics and Gynecology

## 2020-09-01 ENCOUNTER — Ambulatory Visit: Payer: 59 | Admitting: Obstetrics and Gynecology

## 2020-09-01 DIAGNOSIS — Z0289 Encounter for other administrative examinations: Secondary | ICD-10-CM

## 2020-09-01 NOTE — Progress Notes (Deleted)
50 y.o. G68P0020 Married Caucasian female here for annual exam.    PCP:     No LMP recorded.           Sexually active: {yes no:314532}  The current method of family planning is *** abstinence/Micronor.    Exercising: {yes no:314532}  {types:19826} Smoker:  no  Health Maintenance: Pap: 03/11/16 Neg:Neg HR HPV, 02-05-13 Neg:Neg HR HPV, 12-29-11 Neg History of abnormal Pap:  no MMG:  06-19-20 3D/Neg/BiRads1 Colonoscopy:  n/a BMD:   n/a  Result  n/a TDaP:  2015 Gardasil:   no HIV:Neg in preg Hep C:no Screening Labs:  Hb today: ***, Urine today: ***   reports that she has quit smoking. She has never used smokeless tobacco. She reports current alcohol use of about 4.0 standard drinks of alcohol per week. She reports that she does not use drugs.  Past Medical History:  Diagnosis Date  . Cancer ([place]) 07/2019   skin cancer removed from back  . 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 Infertility, female   . Migraine, menstrual     Past Surgical History:  Procedure Laterality Date  . APPENDECTOMY  1986  . dilatation and curettage  2010, 2011  . ovarian egg retrieval  2013  . pregenetic diagnostic  testing  2013    Current Outpatient Medications  Medication Sig Dispense Refill  . Multiple Vitamin (MULTIVITAMIN) capsule Take 1 capsule by mouth daily.    . norethindrone (MICRONOR) 0.35 MG tablet Take 1 tablet (0.35 mg total) by mouth daily. 3 Package 3  . valACYclovir (VALTREX) 500 MG tablet Take 500 mg by mouth as needed.     No current facility-administered medications for this visit.    Family History  Problem Relation Age of Onset  . Heart attack Father   . Hypertension Father   . Thyroid disease Mother        hypothyroid  . Cancer Maternal Grandfather        colon cancer  . Diabetes Maternal Grandmother        diet controlled  . Breast cancer Neg Hx     Review of Systems  Exam:   There were no vitals taken for this visit.     General appearance: alert, cooperative and appears stated age Head: normocephalic, without obvious abnormality, atraumatic Neck: no adenopathy, supple, symmetrical, trachea midline and thyroid normal to inspection and palpation Lungs: clear to auscultation bilaterally Breasts: normal appearance, no masses or tenderness, No nipple retraction or dimpling, No nipple discharge or bleeding, No axillary adenopathy Heart: regular rate and rhythm Abdomen: soft, non-tender; no masses, no organomegaly Extremities: extremities normal, atraumatic, no cyanosis or edema Skin: skin color, texture, turgor normal. No rashes or lesions Lymph nodes: cervical, supraclavicular, and axillary nodes normal. Neurologic: grossly normal  Pelvic: External genitalia:  no lesions              No abnormal inguinal nodes palpated.              Urethra:  normal appearing urethra with no masses, tenderness or lesions              Bartholins and Skenes: normal                 Vagina: normal appearing vagina with normal color and discharge, no lesions              Cervix: no lesions  Pap taken: {yes no:314532} Bimanual Exam:  Uterus:  normal size, contour, position, consistency, mobility, non-tender              Adnexa: no mass, fullness, tenderness              Rectal exam: {yes no:314532}.  Confirms.              Anus:  normal sphincter tone, no lesions  Chaperone was present for exam.  Assessment:   Well woman visit with normal exam.   Plan: Mammogram screening discussed. Self breast awareness reviewed. Pap and HR HPV as above. Guidelines for Calcium, Vitamin D, regular exercise program including cardiovascular and weight bearing exercise.   Follow up annually and prn.   Additional counseling given.  {yes [id number]. _______ minutes face to face time of which over 50% was spent in counseling.    After visit summary provided.

## 2020-09-07 ENCOUNTER — Other Ambulatory Visit: Payer: Self-pay | Admitting: Obstetrics and Gynecology

## 2020-09-18 ENCOUNTER — Telehealth: Payer: Self-pay | Admitting: *Deleted

## 2020-09-18 MED ORDER — NORETHINDRONE 0.35 MG PO TABS: 1.0000 | ORAL_TABLET | Freq: Every day | ORAL | 0 refills | Status: DC

## 2020-09-18 NOTE — Telephone Encounter (Signed)
Annual exam scheduled on 09/28/20. Rx sent. Mammogram up to date on 06/2020

## 2020-09-18 NOTE — Telephone Encounter (Signed)
-----   Message from Sinclair Grooms sent at 09/18/2020 12:16 PM EDT ----- Left message to call and schedule ----- Message ----- From: Thamas Jaegers, RMA Sent: 09/18/2020  11:31 AM EDT To: Sinclair Grooms  Patient requesting refill on birth control pills, annual exam was due in May, last annual exam May 2021. Once schedule I can approved, can you call to schedule.[phone]  Thanks!

## 2020-09-28 ENCOUNTER — Ambulatory Visit (INDEPENDENT_AMBULATORY_CARE_PROVIDER_SITE_OTHER): Payer: 59 | Admitting: Obstetrics and Gynecology

## 2020-09-28 ENCOUNTER — Other Ambulatory Visit: Payer: Self-pay

## 2020-09-28 ENCOUNTER — Other Ambulatory Visit (HOSPITAL_COMMUNITY)
Admission: RE | Admit: 2020-09-28 | Discharge: 2020-09-28 | Disposition: A | Discharge status: Home / Self Care | Payer: 59 | Source: Ambulatory Visit | Attending: Obstetrics and Gynecology | Admitting: Obstetrics and Gynecology

## 2020-09-28 ENCOUNTER — Encounter: Payer: Self-pay | Admitting: Obstetrics and Gynecology

## 2020-09-28 VITALS — BP 112/70 | HR 71 | Ht 67.5 in | Wt 175.0 lb

## 2020-09-28 DIAGNOSIS — Z01419 Encounter for gynecological examination (general) (routine) without abnormal findings: Secondary | ICD-10-CM | POA: Diagnosis present

## 2020-09-28 LAB — CBC
HCT: 41.7 % (ref 35.0–45.0)
Hemoglobin: 13.4 g/dL (ref 11.7–15.5)
MCH: 29.7 pg (ref 27.0–33.0)
MCHC: 32.1 g/dL (ref 32.0–36.0)
MCV: 92.5 fL (ref 80.0–100.0)
MPV: 9.8 fL (ref 7.5–12.5)
Platelets: 212 10*3/uL (ref 140–400)
RBC: 4.51 10*6/uL (ref 3.80–5.10)
RDW: 12.3 % (ref 11.0–15.0)
WBC: 8 10*3/uL (ref 3.8–10.8)

## 2020-09-28 LAB — COMPREHENSIVE METABOLIC PANEL
AG Ratio: 1.7 (calc) (ref 1.0–2.5)
ALT: 10 U/L (ref 6–29)
AST: 18 U/L (ref 10–35)
Albumin: 4.4 g/dL (ref 3.6–5.1)
Alkaline phosphatase (APISO): 33 U/L (ref 31–125)
BUN: 24 mg/dL (ref 7–25)
CO2: 28 mmol/L (ref 20–32)
Calcium: 9.3 mg/dL (ref 8.6–10.2)
Chloride: 103 mmol/L (ref 98–110)
Creat: 1.01 mg/dL (ref 0.50–1.10)
Globulin: 2.6 g/dL (calc) (ref 1.9–3.7)
Glucose, Bld: 84 mg/dL (ref 65–99)
Potassium: 4.2 mmol/L (ref 3.5–5.3)
Sodium: 139 mmol/L (ref 135–146)
Total Bilirubin: 0.5 mg/dL (ref 0.2–1.2)
Total Protein: 7 g/dL (ref 6.1–8.1)

## 2020-09-28 LAB — LIPID PANEL
Cholesterol: 132 mg/dL (ref <200)
HDL: 66 mg/dL (ref >=50)
LDL Cholesterol (Calc): 52 mg/dL (calc)
Non-HDL Cholesterol (Calc): 66 mg/dL (calc) (ref <130)
Total CHOL/HDL Ratio: 2 (calc) (ref <5.0)
Triglycerides: 62 mg/dL (ref <150)

## 2020-09-28 LAB — VITAMIN D 25 HYDROXY (VIT D DEFICIENCY, FRACTURES): Vit D, 25-Hydroxy: 54 ng/mL (ref 30–100)

## 2020-09-28 MED ORDER — NORETHINDRONE 0.35 MG PO TABS: 1.0000 | ORAL_TABLET | Freq: Every day | ORAL | 3 refills | Status: DC

## 2020-09-28 NOTE — Progress Notes (Signed)
50 y.o. G25P0020 Married Caucasian female here for annual exam.    Taking POPs and if she misses a day, she has breakthrough bleeding or can have heavier flow and more cramping.  Wants to continue POPs.   Son is 2 1/2 yo.  Patient is doing a 5K for 4th of July.   Has been vaccinated and booster against Covid.   PCP:   London Pepper, MD  Patient's last menstrual period was 09/09/2020 (exact date).     Period Cycle (Days): 30 Period Duration (Days): 2 Period Pattern: Regular Menstrual Flow:  (different every month) Menstrual Control: Tampon Menstrual Control Change Freq (Hours): different every month Dysmenorrhea: (!) Mild Dysmenorrhea Symptoms: Cramping     Sexually active: No.  The current method of family planning is abstinence/Micronor.    Exercising: Yes.     Boot camp 4days/week Smoker:  no  Health Maintenance: Pap: 03/11/16 Neg:Neg HR HPV, 02-05-13 Neg:Neg HR HPV, 12-29-11 Neg History of abnormal Pap:  no MMG: 06-18-20 3D/Neg/BiRads1 Colonoscopy: 07/2020 diverticulosis;next 5 years BMD:   n/a  Result  n/a TDaP:  2015 Gardasil:   no HIV:Neg in preg Hep C:never Screening Labs:  today   reports that she has quit smoking. She has never used smokeless tobacco. She reports current alcohol use of about 4.0 standard drinks of alcohol per week. She reports that she does not use drugs.  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     Multiple Vitamins-Minerals (MULTI  FOR HER) TABS 1 tablet     valACYclovir (VALTREX) 500 MG tablet Take 500 mg by mouth as needed.     norethindrone (MICRONOR) 0.35 MG tablet Take 1 tablet (0.35 mg total) by mouth daily. 90 tablet 3   No current facility-administered medications for this visit.    Family History  Problem Relation Age of Onset   Heart attack Father    Hypertension Father    Thyroid disease Mother        hypothyroid   Cancer Maternal Grandfather        colon cancer   Diabetes Maternal Grandmother        diet controlled   Breast cancer Neg Hx     Review of Systems  All other systems reviewed and are negative.  Exam:   BP 112/70   Pulse 71   Ht 5' 7.5" (1.715 m)   Wt 175 lb (79.4 kg)   LMP 09/09/2020 (Exact Date)   SpO2 99%   BMI 27.00 kg/m     General appearance: alert, cooperative and appears stated age Head: normocephalic, without obvious abnormality, atraumatic Neck: no adenopathy, supple, symmetrical, trachea midline and thyroid normal to inspection and palpation Lungs: clear to auscultation bilaterally Breasts: normal appearance, no masses or tenderness, No nipple retraction or dimpling, No nipple discharge or bleeding, No axillary adenopathy Heart: regular rate and rhythm Abdomen: soft, non-tender; no masses, no organomegaly Extremities: extremities normal, atraumatic, no  cyanosis or edema Skin: skin color, texture, turgor normal. No rashes or lesions Lymph nodes: cervical, supraclavicular, and axillary nodes normal. Neurologic: grossly normal  Pelvic: External genitalia:  no lesions              No abnormal inguinal nodes palpated.              Urethra:  normal appearing urethra with no masses, tenderness or lesions              Bartholins and Skenes: normal                 Vagina: normal appearing vagina with normal color and discharge, no lesions              Cervix: no lesions              Pap taken: yes. Bimanual Exam:  Uterus:  normal size, contour, position, consistency,  mobility, non-tender              Adnexa: no mass, fullness, tenderness              Rectal exam: Yes.  .  Confirms.              Anus:  normal sphincter tone, no lesions  Chaperone was present for exam.  Assessment:   Well woman visit with normal exam.   Plan: Mammogram screening discussed. Self breast awareness reviewed. Pap and HR HPV as above. Guidelines for Calcium, Vitamin D, regular exercise program including cardiovascular and weight bearing exercise. Routine labs.  Refill of POPs for one year.  Follow up annually and prn.

## 2020-09-28 NOTE — Patient Instructions (Signed)

## 2020-09-30 LAB — CYTOLOGY - PAP
Comment: NEGATIVE
Diagnosis: NEGATIVE
High risk HPV: NEGATIVE

## 2021-06-01 ENCOUNTER — Other Ambulatory Visit: Payer: Self-pay | Admitting: Obstetrics and Gynecology

## 2021-06-01 DIAGNOSIS — Z1231 Encounter for screening mammogram for malignant neoplasm of breast: Secondary | ICD-10-CM

## 2021-06-21 ENCOUNTER — Ambulatory Visit: Payer: 59

## 2021-07-09 ENCOUNTER — Ambulatory Visit
Admission: RE | Admit: 2021-07-09 | Discharge: 2021-07-09 | Disposition: A | Discharge status: Home / Self Care | Payer: PRIVATE HEALTH INSURANCE | Source: Ambulatory Visit | Attending: Obstetrics and Gynecology | Admitting: Obstetrics and Gynecology

## 2021-07-09 DIAGNOSIS — Z1231 Encounter for screening mammogram for malignant neoplasm of breast: Secondary | ICD-10-CM

## 2021-09-30 ENCOUNTER — Ambulatory Visit: Payer: 59 | Admitting: Obstetrics and Gynecology

## 2021-10-21 ENCOUNTER — Other Ambulatory Visit: Payer: Self-pay | Admitting: Obstetrics and Gynecology

## 2021-10-21 NOTE — Telephone Encounter (Signed)
Past due for AEX. Message sent to appt desk to call her to schedule.

## 2021-10-26 NOTE — Telephone Encounter (Signed)
Claudia left two messages with patient's voice mail to call back to schedule AEX. Patient has not called.  Refill refused with a note to call the office.

## 2021-11-10 ENCOUNTER — Other Ambulatory Visit: Payer: Self-pay

## 2021-11-10 MED ORDER — NORETHINDRONE 0.35 MG PO TABS
1.0000 | ORAL_TABLET | Freq: Every day | ORAL | 0 refills | Status: DC
Start: 2021-11-10 — End: 2021-12-08

## 2021-11-10 NOTE — Telephone Encounter (Signed)
Patient called requesting refill on her bcp.  She said she had been previously scheduled for 09/30/21 and we called and cancelled due to Dr. Quincy Simmonds in surgery. "Time got away from me" and she called today and r/s'd for 12/08/21.  MMG 07/09/21 neg

## 2021-12-08 ENCOUNTER — Ambulatory Visit (INDEPENDENT_AMBULATORY_CARE_PROVIDER_SITE_OTHER): Payer: No Typology Code available for payment source | Admitting: Obstetrics and Gynecology

## 2021-12-08 ENCOUNTER — Encounter: Payer: Self-pay | Admitting: Obstetrics and Gynecology

## 2021-12-08 VITALS — BP 120/72 | HR 74 | Ht 68.0 in | Wt 176.0 lb

## 2021-12-08 DIAGNOSIS — R7989 Other specified abnormal findings of blood chemistry: Secondary | ICD-10-CM

## 2021-12-08 DIAGNOSIS — Z01419 Encounter for gynecological examination (general) (routine) without abnormal findings: Secondary | ICD-10-CM

## 2021-12-08 DIAGNOSIS — Z0000 Encounter for general adult medical examination without abnormal findings: Secondary | ICD-10-CM

## 2021-12-08 MED ORDER — NORETHINDRONE 0.35 MG PO TABS
1.0000 | ORAL_TABLET | Freq: Every day | ORAL | 3 refills | Status: DC
Start: 2021-12-08 — End: 2022-12-21

## 2021-12-08 NOTE — Patient Instructions (Signed)

## 2021-12-08 NOTE — Progress Notes (Signed)
51 y.o. G28P0020 Married Caucasian female here for annual exam.    Wants to discuss BC options.  She did not start her last pack of Micronor.   No migraines with her pills.  She does miss a pill or two.  One or two hot flashes.   PCP:  London Pepper, MD   Patient's last menstrual period was 11/17/2021 (approximate).     Period Cycle (Days): 30 Period Duration (Days): 2 Period Pattern: Regular Menstrual Flow: Light Menstrual Control: Tampon Dysmenorrhea: (!) Mild Dysmenorrhea Symptoms: Cramping     Sexually active: No.  The current method of family planning is Abstinence    Exercising: Yes.     Boot camp Smoker:  no  Health Maintenance: Pap: 09-28-20 Neg:Neg HR HPV, 03/11/16 Neg:Neg HR HPV, 02-05-13 Neg:Neg HR HPV History of abnormal Pap:  no MMG:  07-09-21 Neg/BiRads1 Colonoscopy:  07/2020 diverticulosis;next 5 years BMD:   n/a  Result  n/a TDaP:  2015 Gardasil:   no HIV:Neg in past Hep C:never Screening Labs:  today   reports that she has quit smoking. She has never used smokeless tobacco. She reports current alcohol use of about 4.0 standard drinks of alcohol per week. She reports that she does not use drugs.  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     norethindrone (MICRONOR) 0.35 MG tablet Take 1 tablet (0.35 mg total) by mouth daily. 90 tablet 3   No current facility-administered medications for  this visit.    Family History  Problem Relation Age of Onset   Heart attack Father    Hypertension Father    Thyroid disease Mother        hypothyroid   Cancer Maternal Grandfather        colon cancer   Diabetes Maternal Grandmother        diet controlled   Breast cancer Neg Hx     Review of Systems  All other systems reviewed and are negative.   Exam:   BP 120/72   Pulse 74   Ht '5\' 8"'$  (1.727 m)   Wt 176 lb (79.8 kg)   LMP 11/17/2021 (Approximate)   SpO2 97%   BMI 26.76 kg/m     General appearance: alert, cooperative and appears stated age Head: normocephalic, without obvious abnormality, atraumatic Neck: no adenopathy, supple, symmetrical, trachea midline and thyroid normal to inspection and palpation Lungs: clear to auscultation bilaterally Breasts: normal appearance, no masses or tenderness, No nipple retraction or dimpling, No nipple discharge or bleeding, No axillary adenopathy Heart: regular rate and rhythm Abdomen: soft, non-tender; no masses, no organomegaly Extremities: extremities normal, atraumatic, no cyanosis or edema Skin: skin color, texture, turgor normal. No rashes or lesions Lymph nodes: cervical, supraclavicular, and axillary nodes normal. Neurologic: grossly normal  Pelvic: External genitalia:  no lesions  No abnormal inguinal nodes palpated.              Urethra:  normal appearing urethra with no masses, tenderness or lesions              Bartholins and Skenes: normal                 Vagina: normal appearing vagina with normal color and discharge, no lesions              Cervix: no lesions              Pap taken: no Bimanual Exam:  Uterus:  normal size, contour, position, consistency, mobility, non-tender              Adnexa: no mass, fullness, tenderness              Rectal exam: yes.  Confirms.              Anus:  normal sphincter tone, no lesions  Chaperone was present for exam:  Estill Bamberg, CMA  Assessment:   Well woman visit  with gynecologic exam.   Plan: Mammogram screening discussed. Self breast awareness reviewed. Pap and HR HPV as above. Guidelines for Calcium, Vitamin D, regular exercise program including cardiovascular and weight bearing exercise. We discussed contraceptive options:  POPs, Depo Provera, Nexplanon, and IUDs.  Refill of POPs for one year.   She will restart with her next period. Declines refill of Valtrex.  Routine labs.  Follow up annually and prn.   After visit summary provided.

## 2021-12-09 LAB — LIPID PANEL
Cholesterol: 139 mg/dL (ref <200)
HDL: 62 mg/dL (ref >=50)
LDL Cholesterol (Calc): 57 mg/dL (calc)
Non-HDL Cholesterol (Calc): 77 mg/dL (calc) (ref <130)
Total CHOL/HDL Ratio: 2.2 (calc) (ref <5.0)
Triglycerides: 121 mg/dL (ref <150)

## 2021-12-09 LAB — CBC
HCT: 39.4 % (ref 35.0–45.0)
Hemoglobin: 13 g/dL (ref 11.7–15.5)
MCH: 30.4 pg (ref 27.0–33.0)
MCHC: 33 g/dL (ref 32.0–36.0)
MCV: 92.1 fL (ref 80.0–100.0)
MPV: 9.7 fL (ref 7.5–12.5)
Platelets: 214 10*3/uL (ref 140–400)
RBC: 4.28 10*6/uL (ref 3.80–5.10)
RDW: 12.5 % (ref 11.0–15.0)
WBC: 7.6 10*3/uL (ref 3.8–10.8)

## 2021-12-09 LAB — COMPREHENSIVE METABOLIC PANEL
AG Ratio: 1.7 (calc) (ref 1.0–2.5)
ALT: 14 U/L (ref 6–29)
AST: 20 U/L (ref 10–35)
Albumin: 4.2 g/dL (ref 3.6–5.1)
Alkaline phosphatase (APISO): 35 U/L (ref 31–125)
BUN/Creatinine Ratio: 25 (calc) — ABNORMAL HIGH (ref 6–22)
BUN: 27 mg/dL — ABNORMAL HIGH (ref 7–25)
CO2: 24 mmol/L (ref 20–32)
Calcium: 9.3 mg/dL (ref 8.6–10.2)
Chloride: 105 mmol/L (ref 98–110)
Creat: 1.08 mg/dL — ABNORMAL HIGH (ref 0.50–0.99)
Globulin: 2.5 g/dL (calc) (ref 1.9–3.7)
Glucose, Bld: 86 mg/dL (ref 65–99)
Potassium: 4 mmol/L (ref 3.5–5.3)
Sodium: 140 mmol/L (ref 135–146)
Total Bilirubin: 0.4 mg/dL (ref 0.2–1.2)
Total Protein: 6.7 g/dL (ref 6.1–8.1)

## 2021-12-09 LAB — TSH: TSH: 2.68 mIU/L

## 2021-12-10 NOTE — Addendum Note (Signed)
Addended by: Yisroel Ramming, Dietrich Pates E on: 12/10/2021 08:37 PM   Modules accepted: Orders

## 2021-12-28 ENCOUNTER — Other Ambulatory Visit: Payer: No Typology Code available for payment source

## 2021-12-28 DIAGNOSIS — R7989 Other specified abnormal findings of blood chemistry: Secondary | ICD-10-CM

## 2021-12-29 ENCOUNTER — Encounter: Payer: Self-pay | Admitting: Obstetrics and Gynecology

## 2021-12-29 LAB — BASIC METABOLIC PANEL WITH GFR
BUN/Creatinine Ratio: 18 (calc) (ref 6–22)
BUN: 19 mg/dL (ref 7–25)
CO2: 27 mmol/L (ref 20–32)
Calcium: 9.4 mg/dL (ref 8.6–10.2)
Chloride: 104 mmol/L (ref 98–110)
Creat: 1.05 mg/dL — ABNORMAL HIGH (ref 0.50–0.99)
Glucose, Bld: 93 mg/dL (ref 65–99)
Potassium: 4.4 mmol/L (ref 3.5–5.3)
Sodium: 140 mmol/L (ref 135–146)
eGFR: 66 mL/min/{1.73_m2} (ref >=60)

## 2022-01-28 IMAGING — MG MM DIGITAL SCREENING BILAT W/ TOMO AND CAD
8 series · 9 of 24 positions shown · non-contrast
Comparison: Previous exam(s).

CLINICAL DATA: Screening.

EXAM:
DIGITAL SCREENING BILATERAL MAMMOGRAM WITH TOMOSYNTHESIS AND CAD
TECHNIQUE: Bilateral screening digital craniocaudal and mediolateral oblique
mammograms were obtained. Bilateral screening digital breast
tomosynthesis was performed. The images were evaluated with
computer-aided detection.

[L CC synth-2D]
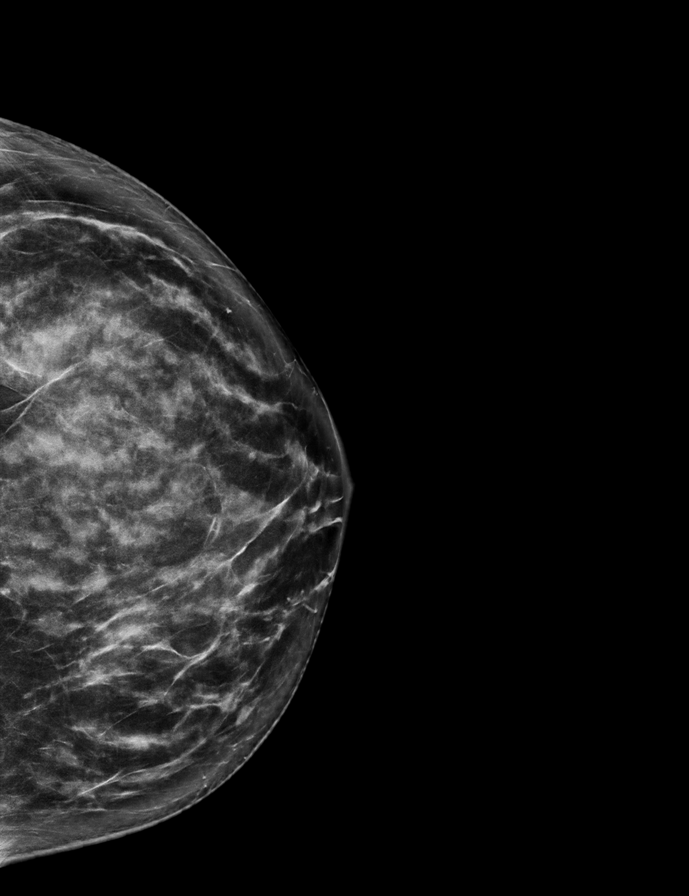

[L MLO synth-2D]
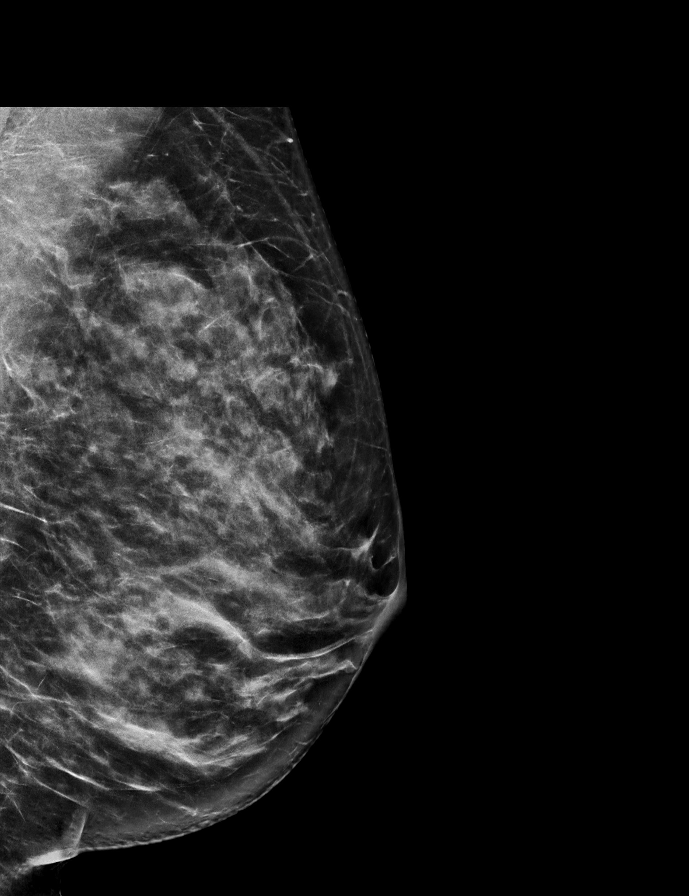

[R MLO synth-2D]
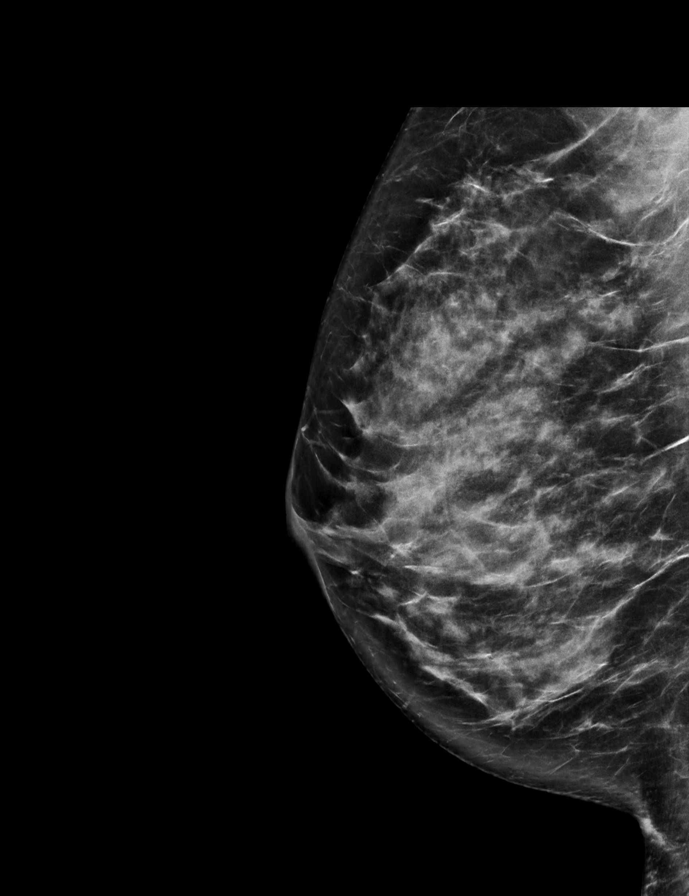

[R CC synth-2D]
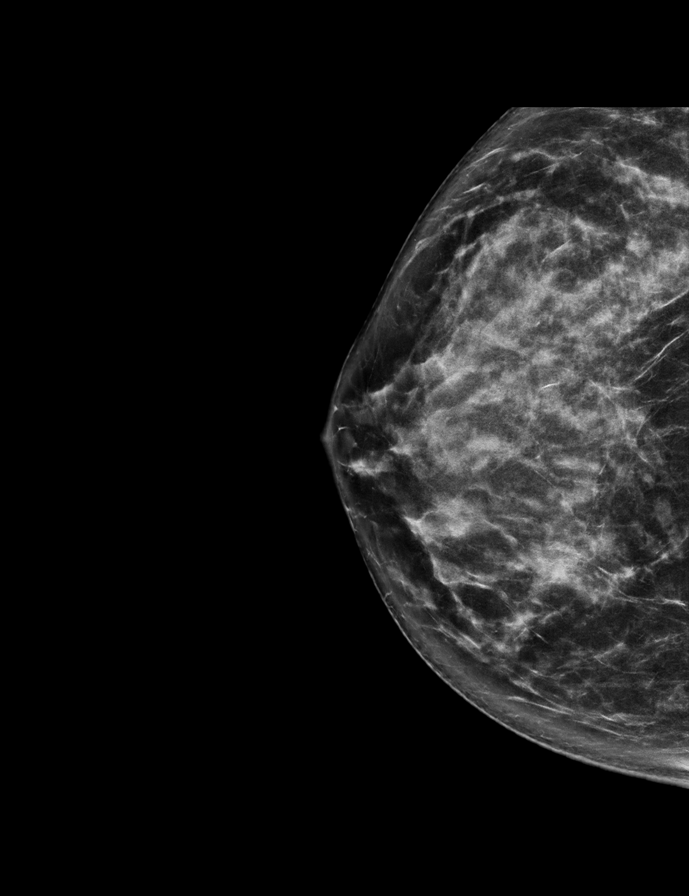

[R CC tomo · 2 of 71 frames shown]
[frame 23/71]
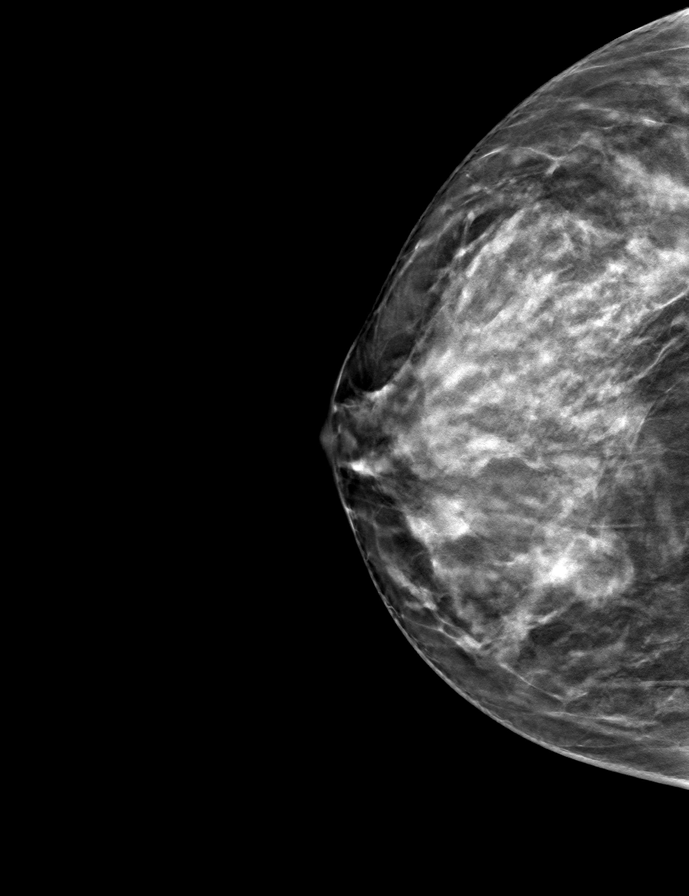
[frame 36/71]
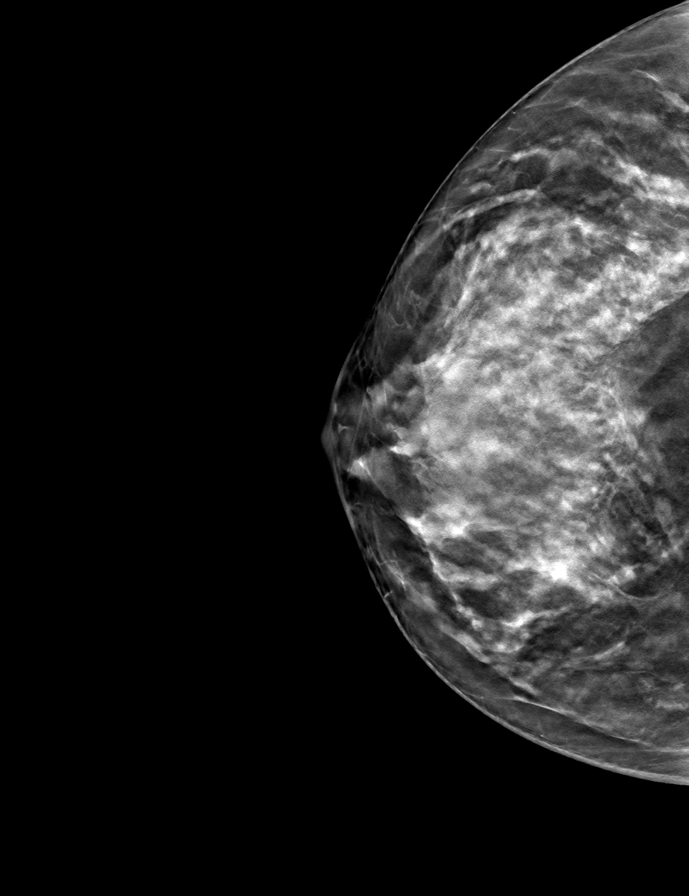

[L MLO tomo · tomo slice 36/71.0]
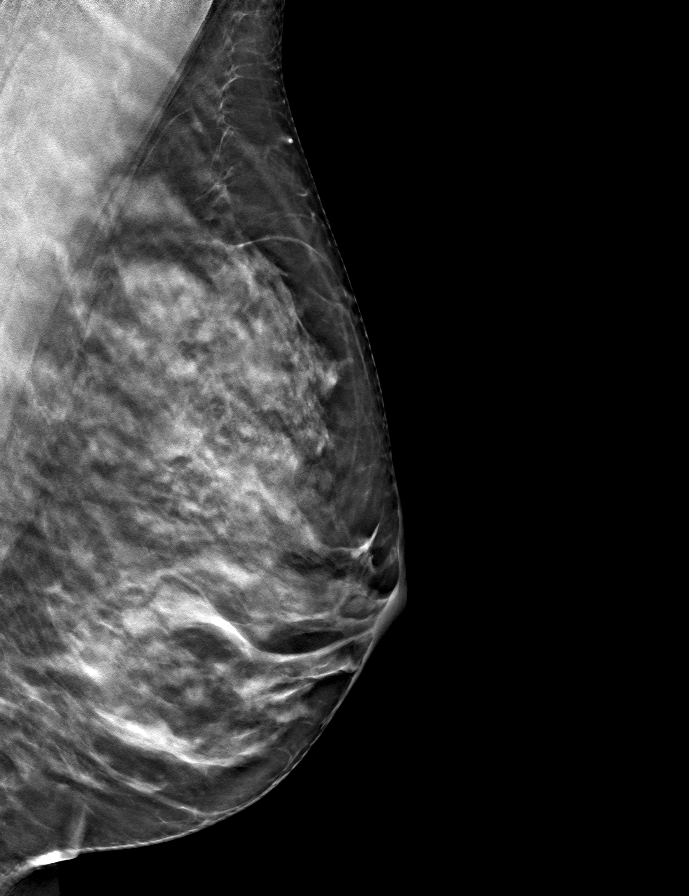

[L CC tomo · tomo slice 37/72.0]
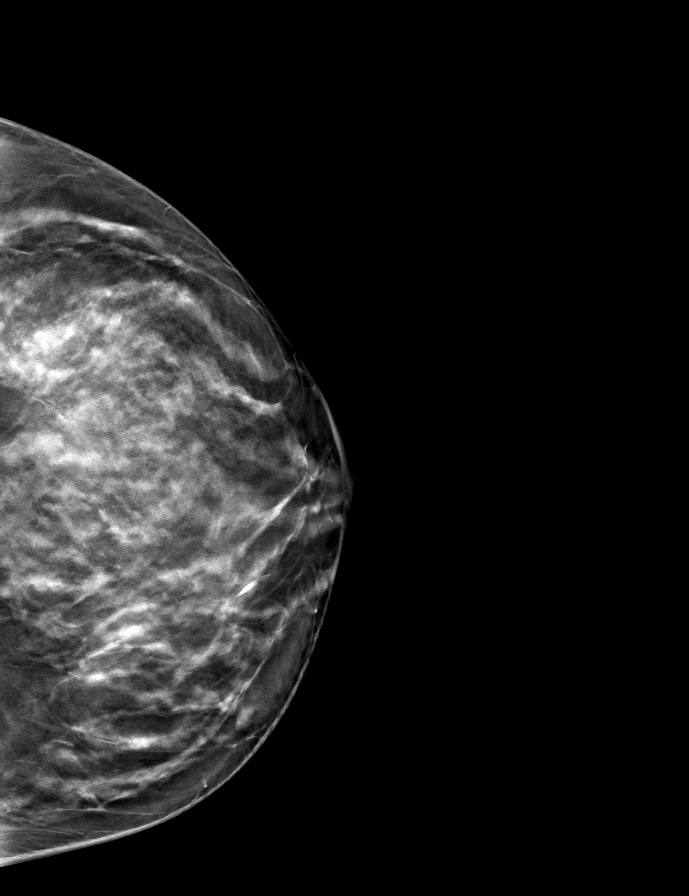

[R MLO tomo · tomo slice 37/73.0]
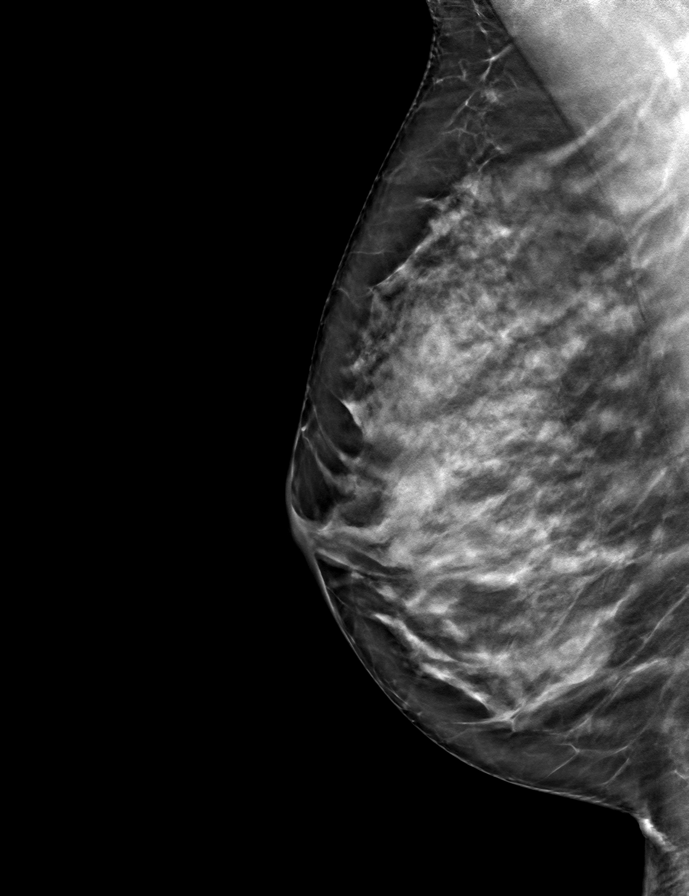

[9 of 24 positions shown; findings below may reference images not displayed]

ACR Breast Density Category c: The breast tissue is heterogeneously
dense, which may obscure small masses.
FINDINGS: There are no findings suspicious for malignancy. The images were
evaluated with computer-aided detection.
IMPRESSION: No mammographic evidence of malignancy. A result letter of this
screening mammogram will be mailed directly to the patient.

RECOMMENDATION:
Screening mammogram in one year. (Code:T4-5-GWO)

BI-RADS CATEGORY  1: Negative.

## 2022-05-04 ENCOUNTER — Emergency Department (HOSPITAL_BASED_OUTPATIENT_CLINIC_OR_DEPARTMENT_OTHER)
Admission: EM | Admit: 2022-05-04 | Discharge: 2022-05-04 | Disposition: A | Discharge status: Home / Self Care | Payer: PRIVATE HEALTH INSURANCE | Attending: Emergency Medicine | Admitting: Emergency Medicine

## 2022-05-04 ENCOUNTER — Emergency Department (HOSPITAL_BASED_OUTPATIENT_CLINIC_OR_DEPARTMENT_OTHER): Payer: PRIVATE HEALTH INSURANCE

## 2022-05-04 ENCOUNTER — Encounter (HOSPITAL_BASED_OUTPATIENT_CLINIC_OR_DEPARTMENT_OTHER): Payer: Self-pay

## 2022-05-04 ENCOUNTER — Other Ambulatory Visit: Payer: Self-pay

## 2022-05-04 DIAGNOSIS — S29011A Strain of muscle and tendon of front wall of thorax, initial encounter: Secondary | ICD-10-CM | POA: Insufficient documentation

## 2022-05-04 DIAGNOSIS — X500XXA Overexertion from strenuous movement or load, initial encounter: Secondary | ICD-10-CM | POA: Diagnosis not present

## 2022-05-04 DIAGNOSIS — T148XXA Other injury of unspecified body region, initial encounter: Secondary | ICD-10-CM

## 2022-05-04 DIAGNOSIS — U071 COVID-19: Secondary | ICD-10-CM | POA: Insufficient documentation

## 2022-05-04 DIAGNOSIS — R079 Chest pain, unspecified: Secondary | ICD-10-CM

## 2022-05-04 DIAGNOSIS — S299XXA Unspecified injury of thorax, initial encounter: Secondary | ICD-10-CM | POA: Diagnosis present

## 2022-05-04 LAB — BASIC METABOLIC PANEL
Anion gap: 7 (ref 5–15)
BUN: 24 mg/dL — ABNORMAL HIGH (ref 6–20)
CO2: 27 mmol/L (ref 22–32)
Calcium: 8.6 mg/dL — ABNORMAL LOW (ref 8.9–10.3)
Chloride: 103 mmol/L (ref 98–111)
Creatinine, Ser: 0.98 mg/dL (ref 0.44–1.00)
GFR, Estimated: >60 mL/min (ref >60)
Glucose, Bld: 121 mg/dL — ABNORMAL HIGH (ref 70–99)
Potassium: 3.5 mmol/L (ref 3.5–5.1)
Sodium: 137 mmol/L (ref 135–145)

## 2022-05-04 LAB — CBC
HCT: 40.3 % (ref 36.0–46.0)
Hemoglobin: 13.3 g/dL (ref 12.0–15.0)
MCH: 29.2 pg (ref 26.0–34.0)
MCHC: 33 g/dL (ref 30.0–36.0)
MCV: 88.6 fL (ref 80.0–100.0)
Platelets: 177 10*3/uL (ref 150–400)
RBC: 4.55 MIL/uL (ref 3.87–5.11)
RDW: 13.2 % (ref 11.5–15.5)
WBC: 5.9 10*3/uL (ref 4.0–10.5)
nRBC: 0 % (ref 0.0–0.2)

## 2022-05-04 LAB — D-DIMER, QUANTITATIVE: D-Dimer, Quant: 0.27 ug/mL-FEU (ref 0.00–0.50)

## 2022-05-04 LAB — TROPONIN I (HIGH SENSITIVITY): Troponin I (High Sensitivity): 3 ng/L (ref <18)

## 2022-05-04 MED ORDER — IBUPROFEN 400 MG PO TABS
400.0000 mg | ORAL_TABLET | Freq: Once | ORAL | Status: AC
  Administered 2022-05-04: 400 mg via ORAL
  Filled 2022-05-04: qty 1

## 2022-05-04 MED ORDER — LIDOCAINE 5 % EX PTCH
1.0000 | MEDICATED_PATCH | CUTANEOUS | Status: DC
  Administered 2022-05-04: 1 via TRANSDERMAL
  Filled 2022-05-04: qty 1

## 2022-05-04 NOTE — ED Provider Notes (Signed)
[facility] Provider Note   CSN: [id number] Arrival date & time: 05/04/22  1640     History  Chief Complaint  Patient presents with   Chest Pain    Covid +    Sarah Lloyd is a 52 y.o. female, no pertinent past medical history, who presents to the ED secondary to being sick since Sunday, and testing positive for COVID 2 days ago.  She states that she is primarily here because she developed chest pain earlier today, that is on the right side of her chest, radiating to underneath her armpit, she states it comes and goes, and denies any aggravating factors.  Has not taken anything for pain.  States that she is on Paxlovid for COVID, and is not sure if that was causing the pain.  Chest pain radiates to the back.  Not associated with nausea or vomiting.  No shortness of breath.  Does take estrogen containing products including birth control.     Home Medications Prior to Admission medications   Medication Sig Start Date End Date Taking? Authorizing Provider  Multiple Vitamins-Minerals (MULTI FOR HER) TABS 1 tablet    [provider]  norethindrone (MICRONOR) 0.35 MG tablet Take 1 tablet (0.35 mg total) by mouth daily. 12/08/21   Nunzio Cobbs, MD  valACYclovir (VALTREX) 500 MG tablet Take 500 mg by mouth as needed. 10/12/18   [provider]      Allergies    Erythromycin    Review of Systems   Review of Systems  Respiratory:  Negative for shortness of breath.   Cardiovascular:  Positive for chest pain.    Physical Exam Updated Vital Signs BP 118/85   Pulse 60   Temp 98.8 F (37.1 C) (Oral)   Resp 14   Ht '5\' 8"'$  (1.727 m)   Wt 78.5 kg   LMP 04/13/2022 (Approximate)   SpO2 99%   BMI 26.30 kg/m  Physical Exam Vitals and nursing note reviewed.  Constitutional:      General: She is not in acute distress.    Appearance: She is well-developed.  HENT:     Head: Normocephalic and atraumatic.   Eyes:     Conjunctiva/sclera: Conjunctivae normal.  Cardiovascular:     Rate and Rhythm: Normal rate and regular rhythm.     Heart sounds: No murmur heard. Pulmonary:     Effort: Pulmonary effort is normal. No respiratory distress.     Breath sounds: Normal breath sounds.  Chest:     Comments: Tenderness to palpation under right axilla Abdominal:     Palpations: Abdomen is soft.     Tenderness: There is no abdominal tenderness.  Musculoskeletal:        General: No swelling.     Cervical back: Neck supple.  Skin:    General: Skin is warm and dry.     Capillary Refill: Capillary refill takes less than 2 seconds.  Neurological:     Mental Status: She is alert.  Psychiatric:        Mood and Affect: Mood normal.     ED Results / Procedures / Treatments   Labs (all labs ordered are listed, but only abnormal results are displayed) Labs Reviewed  BASIC METABOLIC PANEL - Abnormal; Notable for the following components:      Result Value   Glucose, Bld 121 (*)    BUN 24 (*)    Calcium 8.6 (*)    All  other components within normal limits  CBC  D-DIMER, QUANTITATIVE  TROPONIN I (HIGH SENSITIVITY)    EKG EKG Interpretation  Date/Time:  Wednesday May 04 2022 16:52:04 EST Ventricular Rate:  72 PR Interval:  168 QRS Duration: 91 QT Interval:  372 QTC Calculation: 408 R Axis:   -8 Text Interpretation: Sinus rhythm Borderline T abnormalities, anterior leads Confirmed by Nanda Quinton [phone]) on 05/04/2022 5:14:54 PM  Radiology DG Chest Portable 1 View  Result Date: 05/04/2022 CLINICAL DATA:  Chest pain.  Positive COVID.  Cough. EXAM: PORTABLE CHEST 1 VIEW COMPARISON:  None Available. FINDINGS: The cardiac silhouette, mediastinal and hilar contours are normal. The lungs are clear. No infiltrates or effusions. The bony thorax is intact. IMPRESSION: No acute cardiopulmonary findings. Electronically Signed   By: Marijo Sanes M.D.   On: 05/04/2022 17:11     Procedures Procedures    Medications Ordered in ED Medications  lidocaine (LIDODERM) 5 % 1 patch (1 patch Transdermal Patch Applied 05/04/22 1803)  ibuprofen (ADVIL) tablet 400 mg (400 mg Oral Given 05/04/22 1803)    ED Course/ Medical Decision Making/ A&P           HEART Score: 2                Medical Decision Making Patient is a 52 year old female, here for chest pain that is on the lower right side of her chest, she states she also has COVID.  She is tender to the right chest wall, and endorses chest pain, not worse with different positions, but and nonpleuritic.  We will obtain a D-dimer, she is on oral contraceptives, troponins, EKG, and chest x-ray for further evaluation.  She is not acutely ill appearing.  We will also give her lidocaine patch to help with the pain.  Amount and/or Complexity of Data Reviewed Labs: ordered.    Details: Troponin within normal limits, D-dimer less than 0.5 Radiology: ordered.    Details: Chest x-ray clear. Discussion of management or test interpretation with external provider(s): Discussed with patient likely secondary to a muscle strain, from lifting her 68-year-old up, discussed return precautions, need for follow-up with PCP as needed.  Ice/heat for control of pain.  Ibuprofen and Tylenol.  She is not ill-appearing, she has no rub, and position does not change her pain, thus pericarditis myocarditis less likely.  Risk Prescription drug management.   Final Clinical Impression(s) / ED Diagnoses Final diagnoses:  Chest pain, unspecified type  Muscle strain    Rx / DC Orders ED Discharge Orders     None         Osvaldo Shipper, [place] 05/04/22 1914    Margette Fast, MD 05/09/22 0730

## 2022-05-04 NOTE — ED Triage Notes (Signed)
Started feeling sick Sunday, tested positive for covid Monday. Prescribed generic paxlovid, 2nd day taking it. Started having right sided chest pain radiating to axilla and back.

## 2022-05-04 NOTE — Discharge Instructions (Addendum)
Please follow-up with your primary care doctor as needed.  You can take Tylenol and ibuprofen for pain control.  You can also use ice or heat on the area to help with the pain control.  Return if you have severe pain, shortness of breath, swelling of 1 limb, or intractable nausea or vomiting.

## 2022-05-04 NOTE — ED Notes (Signed)
D/c paperwork reviewed with pt, including follow up care.  No questions or concerns voiced at time of d/c. . Pt verbalized understanding, Ambulatory with family to ED exit, NAD.   

## 2022-06-06 ENCOUNTER — Encounter: Payer: Self-pay | Admitting: Obstetrics and Gynecology

## 2022-06-06 ENCOUNTER — Other Ambulatory Visit: Payer: Self-pay | Admitting: Obstetrics and Gynecology

## 2022-06-06 DIAGNOSIS — Z1231 Encounter for screening mammogram for malignant neoplasm of breast: Secondary | ICD-10-CM

## 2022-07-20 ENCOUNTER — Inpatient Hospital Stay: Admission: RE | Admit: 2022-07-20 | Payer: PRIVATE HEALTH INSURANCE | Source: Ambulatory Visit

## 2022-12-13 ENCOUNTER — Other Ambulatory Visit: Payer: Self-pay | Admitting: Obstetrics and Gynecology

## 2022-12-14 NOTE — Telephone Encounter (Signed)
Med refill request: POPs Last AEX: 12/08/2021 Next AEX: Recall sent per EMR, nothing currently scheduled. Last MMG (if hormonal med): 07/2022-birads 2 benign (In care everywhere) Refill authorized: rx pend.  Will send msg to appt desk to reach out to pt to schedule AEX.

## 2022-12-16 NOTE — Telephone Encounter (Signed)
12/14/2022: Sarah Lloyd, CMA Left message for patient to call and schedule appointment."  Pt has not called back to schedule appt and pharmacy is requesting f/u.  Will refuse rx request now and note that pt needs to contact office for an appt.   Routing to provider for final review.

## 2022-12-21 MED ORDER — NORETHINDRONE 0.35 MG PO TABS: 1.0000 | ORAL_TABLET | Freq: Every day | ORAL | 0 refills | Status: DC

## 2022-12-21 NOTE — Addendum Note (Signed)
Addended by: Leda Min on: 12/21/2022 03:47 PM   Modules accepted: Orders

## 2022-12-21 NOTE — Telephone Encounter (Signed)
Patient returned call, AEX scheduled for 04/25/23.   Please review micronor refill request.

## 2023-01-09 ENCOUNTER — Other Ambulatory Visit: Payer: Self-pay

## 2023-01-09 ENCOUNTER — Ambulatory Visit (INDEPENDENT_AMBULATORY_CARE_PROVIDER_SITE_OTHER): Payer: PRIVATE HEALTH INSURANCE

## 2023-01-09 DIAGNOSIS — M79671 Pain in right foot: Secondary | ICD-10-CM

## 2023-01-09 NOTE — Progress Notes (Unsigned)
[name] [address] [place] [address] Phone: [phone] Subjective:   Bruce Donath, am serving as a scribe for Dr. Antoine Primas.  I'm seeing this patient by the request  of:  Farris Has, MD  CC: Right foot pain  BJY:[id number]  Sarah Lloyd is a 52 y.o. female coming in with complaint of R foot pain. Patient states that she was chasing her son around the car and she felt some pain at the base of the 5th. Pain will shoot into foot. Weightbearing is painful as well as the pressure from her shoe increases her pain.        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   Elevated serum creatinine    Herpes zoster    Infertility, female    Migraine, menstrual    Past Surgical History:  Procedure Laterality Date   APPENDECTOMY  1986   dilatation and curettage  2010, 2011   ovarian egg retrieval  2013   pregenetic diagnostic  testing  2013   Social History   Socioeconomic History   Marital status: Married    Spouse name: Not on file   Number of children: Not on file   Years of education: Not on file   Highest education level: Not on file  Occupational History   Not on file  Tobacco Use   Smoking status: Former   Smokeless tobacco: Never  Vaping Use   Vaping status: Never Used  Substance and Sexual Activity   Alcohol use: Not Currently    Alcohol/week: 4.0 standard drinks of alcohol    Types: 4 Standard drinks or equivalent per week   Drug use: No   Sexual activity: Not Currently    Partners: Male    Birth control/protection: Abstinence  Other Topics Concern   Not on file  Social History Narrative   Not on file   Social Determinants of Health   Financial Resource Strain: Not on file  Food Insecurity: Not on file  Transportation Needs: Not on file  Physical Activity: Not on file   Stress: Not on file  Social Connections: Unknown (06/07/2022)   Received from [facility], [facility]   Social Network    Social Network: Not on file   Allergies  Allergen Reactions   Erythromycin Rash and Other (See Comments)   Family History  Problem Relation Age of Onset   Heart attack Father    Hypertension Father    Thyroid disease Mother        hypothyroid   Cancer Maternal Grandfather        colon cancer   Diabetes Maternal Grandmother        diet controlled   Breast cancer Neg Hx     Current Outpatient Medications (Endocrine & Metabolic):    norethindrone (MICRONOR) 0.35 MG tablet, Take 1 tablet (0.35 mg total) by mouth daily.    Current Outpatient Medications (Analgesics):    meloxicam (MOBIC) 15 MG tablet, Take 1 tablet (15 mg total) by mouth daily.   Current Outpatient Medications (Other):    Multiple Vitamins-Minerals (MULTI FOR HER) TABS, 1 tablet   valACYclovir (VALTREX) 500 MG tablet, Take 500 mg by mouth as needed.   Reviewed prior external information including notes and imaging from  primary care provider As well as notes that were available from care  everywhere and other healthcare systems.  Past medical history, social, surgical and family history all reviewed in electronic medical record.  No pertanent information unless stated regarding to the chief complaint.   Review of Systems:  No headache, visual changes, nausea, vomiting, diarrhea, constipation, dizziness, abdominal pain, skin rash, fevers, chills, night sweats, weight loss, swollen lymph nodes, body aches, joint swelling, chest pain, shortness of breath, mood changes. POSITIVE muscle aches  Objective  Blood pressure 112/72, height 5\' 8"  (1.727 m), last menstrual period 12/29/2022, SpO2 98%.   General: No apparent distress alert and oriented x3 mood and affect normal, dressed appropriately.  HEENT: Pupils equal, extraocular movements intact  Respiratory: Patient's speak in full  sentences and does not appear short of breath  Cardiovascular: No lower extremity edema, non tender, no erythema  Right foot exam shows the patient does have a mild antalgic gait.  Tender actually distally on the fifth metatarsal.  Not proximally.  Neurovascularly intact proximally.  Ankle seems to be fairly related at the moment.   Limited muscular skeletal ultrasound was performed and interpreted by Antoine Primas, M  Limited ultrasound shows no significant bony abnormality noted at the moment.  She does have some very mild increase in thickening of the fifth metatarsal but no true cortical irregularity noted.  Very mild increase in Doppler. Impression: Questionable stress reaction of the fifth metatarsal but otherwise fairly unremarkable.   Impression and Recommendations:    The above documentation has been reviewed and is accurate and complete [name], DO

## 2023-01-10 ENCOUNTER — Encounter: Payer: Self-pay | Admitting: Family Medicine

## 2023-01-10 ENCOUNTER — Ambulatory Visit (INDEPENDENT_AMBULATORY_CARE_PROVIDER_SITE_OTHER): Payer: PRIVATE HEALTH INSURANCE | Admitting: Family Medicine

## 2023-01-10 ENCOUNTER — Other Ambulatory Visit: Payer: Self-pay

## 2023-01-10 VITALS — BP 112/72 | Ht 68.0 in

## 2023-01-10 DIAGNOSIS — S99921A Unspecified injury of right foot, initial encounter: Secondary | ICD-10-CM | POA: Insufficient documentation

## 2023-01-10 DIAGNOSIS — M79671 Pain in right foot: Secondary | ICD-10-CM | POA: Diagnosis not present

## 2023-01-10 MED ORDER — MELOXICAM 15 MG PO TABS: 15.0000 mg | ORAL_TABLET | Freq: Every day | ORAL | 0 refills | Status: DC

## 2023-01-10 NOTE — Patient Instructions (Signed)
Carbon fiber plate Ankle stability Meloxicam 15mg  daily for 10 days then as needed Ice 2x a day Avoid being barefoot See me in 4-5 weeks

## 2023-01-10 NOTE — Assessment & Plan Note (Signed)
Soft tissue injury appears on the foot.  No significant cortical defect noted on x-ray or on ultrasound today.  Believe that it is more secondary to a soft tissue with a potential subluxation of the fifth proximal IP joint.  Patient at this point will wear a carbon fiber plate, rigid sole shoe, avoid being barefoot, discussed vitamin D and K2.  We discussed icing regimen and topical anti-inflammatories, different treatment for her meloxicam.  Follow-up again in 4 weeks

## 2023-02-14 NOTE — Progress Notes (Unsigned)
[facility] [address] [place] [address] Phone: [phone] Subjective:   Sarah Lloyd, am serving as a scribe for Dr. Antoine Primas.  I'm seeing this patient by the request  of:  Farris Has, MD  CC: Right foot pain follow-up  BJY:[id number]  01/10/2023 Soft tissue injury appears on the foot.  No significant cortical defect noted on x-ray or on ultrasound today.  Believe that it is more secondary to a soft tissue with a potential subluxation of the fifth proximal IP joint.  Patient at this point will wear a carbon fiber plate, rigid sole shoe, avoid being barefoot, discussed vitamin D and K2.  We discussed icing regimen and topical anti-inflammatories, different treatment for her meloxicam.  Follow-up again in 4 weeks     Update 02/16/2023 Sarah Lloyd is a 52 y.o. female coming in with complaint of R foot pain. Patient states about the same. Feels better on meloxicam. Did the recommended.       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   Elevated serum creatinine    Herpes zoster    Infertility, female    Migraine, menstrual    Past Surgical History:  Procedure Laterality Date   APPENDECTOMY  1986   dilatation and curettage  2010, 2011   ovarian egg retrieval  2013   pregenetic diagnostic  testing  2013   Social History   Socioeconomic History   Marital status: Married    Spouse name: Not on file   Number of children: Not on file   Years of education: Not on file   Highest education level: Not on file  Occupational History   Not on file  Tobacco Use   Smoking status: Former   Smokeless tobacco: Never  Vaping Use   Vaping status: Never Used  Substance and Sexual Activity   Alcohol use: Not Currently    Alcohol/week: 4.0 standard drinks of alcohol    Types: 4 Standard drinks or  equivalent per week   Drug use: No   Sexual activity: Not Currently    Partners: Male    Birth control/protection: Abstinence  Other Topics Concern   Not on file  Social History Narrative   Not on file   Social Determinants of Health   Financial Resource Strain: Not on file  Food Insecurity: Not on file  Transportation Needs: Not on file  Physical Activity: Not on file  Stress: Not on file  Social Connections: Unknown (06/07/2022)   Received from [facility], [facility]   Social Network    Social Network: Not on file   Allergies  Allergen Reactions   Erythromycin Rash and Other (See Comments)   Family History  Problem Relation Age of Onset   Heart attack Father    Hypertension Father    Thyroid disease Mother        hypothyroid   Cancer Maternal Grandfather        colon cancer   Diabetes Maternal Grandmother        diet controlled   Breast cancer Neg Hx     Current Outpatient Medications (Endocrine & Metabolic):    norethindrone (MICRONOR) 0.35 MG tablet, Take 1 tablet (0.35 mg total) by mouth daily.    Current Outpatient Medications (Analgesics):    meloxicam (MOBIC) 15 MG tablet, Take 1 tablet (15 mg  total) by mouth daily.   Current Outpatient Medications (Other):    Multiple Vitamins-Minerals (MULTI FOR HER) TABS, 1 tablet   valACYclovir (VALTREX) 500 MG tablet, Take 500 mg by mouth as needed.   Reviewed prior external information including notes and imaging from  primary care provider As well as notes that were available from care everywhere and other healthcare systems.  Past medical history, social, surgical and family history all reviewed in electronic medical record.  No pertanent information unless stated regarding to the chief complaint.   Review of Systems:  No headache, visual changes, nausea, vomiting, diarrhea, constipation, dizziness, abdominal pain, skin rash, fevers, chills, night sweats, weight loss, swollen lymph nodes, body aches,  joint swelling, chest pain, shortness of breath, mood changes. POSITIVE muscle aches  Objective  Blood pressure 118/82, pulse 71, height 5\' 8"  (1.727 m), weight 183 lb (83 kg), SpO2 98%.   General: No apparent distress alert and oriented x3 mood and affect normal, dressed appropriately.  HEENT: Pupils equal, extraocular movements intact  Respiratory: Patient's speak in full sentences and does not appear short of breath  Cardiovascular: No lower extremity edema, non tender, no erythema  Relatively normal gait at the moment. Patient does have some crepitus noted in the ankle but very minimal.  Patient is tender to palpation over the most distal aspect of the metatarsal near the phalanx.  Does have some increase in thickness of the skin in this area.  Mild callus formation noted.  Limited muscular skeletal ultrasound was performed and interpreted by Antoine Primas, M Limited ultrasound does not show any significant cortical irregularity other than a hard callus formation now in the area that was seen previously.  No significant increase in Doppler flow.  No hypoechoic changes of the metatarsal phalanx joint Impression: Interval improvement    Impression and Recommendations:     The above documentation has been reviewed and is accurate and complete [name], DO

## 2023-02-16 ENCOUNTER — Other Ambulatory Visit: Payer: Self-pay

## 2023-02-16 ENCOUNTER — Ambulatory Visit: Payer: PRIVATE HEALTH INSURANCE | Admitting: Family Medicine

## 2023-02-16 ENCOUNTER — Encounter: Payer: Self-pay | Admitting: Family Medicine

## 2023-02-16 VITALS — BP 118/82 | HR 71 | Ht 68.0 in | Wt 183.0 lb

## 2023-02-16 DIAGNOSIS — S99921A Unspecified injury of right foot, initial encounter: Secondary | ICD-10-CM | POA: Diagnosis not present

## 2023-02-16 DIAGNOSIS — M79671 Pain in right foot: Secondary | ICD-10-CM | POA: Diagnosis not present

## 2023-02-16 NOTE — Assessment & Plan Note (Addendum)
Still having pain on the distal aspect of the metatarsal of the fifth.  None on the proximal aspect.  Seems to have good bony callus formation and noted where the injury seem to be previously.  Discussed icing regimen and home exercises, discussed which activities to do and which ones to avoid.  CAM Walker given today secondary to patient not making significant improvement.  Increase activity slowly.  Follow-up with me again in 4 weeks

## 2023-02-16 NOTE — Patient Instructions (Addendum)
Wart remover cream for 1 week with pumice stone Do not drive in boot See me again in 6 weeks

## 2023-03-07 ENCOUNTER — Other Ambulatory Visit: Payer: Self-pay | Admitting: Obstetrics and Gynecology

## 2023-03-07 NOTE — Telephone Encounter (Signed)
Medication refill request: [email or web address] Last AEX:  12/08/21 Next AEX: 04/25/23 Last MMG (if hormonal medication request): 07/09/21 Bi-rads 1 neg  Refill authorized: #[id number] with 1 rf pended for today

## 2023-03-22 NOTE — Progress Notes (Signed)
[facility] [address] [place] [address] Phone: [phone] Subjective:   Sarah Lloyd, am serving as a scribe for Dr. Antoine Primas.  I'm seeing this patient by the request  of:  Farris Has, MD  CC: right foot pain   ION:[id number]  02/16/2023 Still having pain on the distal aspect of the metatarsal of the fifth.  None on the proximal aspect.  Seems to have good bony callus formation and noted where the injury seem to be previously.  Discussed icing regimen and home exercises, discussed which activities to do and which ones to avoid.  CAM Walker given today secondary to patient not making significant improvement.  Increase activity slowly.  Follow-up with me again in 4 weeks      Update 03/23/2023 Sarah Lloyd is a 52 y.o. female coming in with complaint of L foot pain. Patient states doing better. Weaning off boot slowly.       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   Elevated serum creatinine    Herpes zoster    Infertility, female    Migraine, menstrual    Past Surgical History:  Procedure Laterality Date   APPENDECTOMY  1986   dilatation and curettage  2010, 2011   ovarian egg retrieval  2013   pregenetic diagnostic  testing  2013   Social History   Socioeconomic History   Marital status: Married    Spouse name: Not on file   Number of children: Not on file   Years of education: Not on file   Highest education level: Not on file  Occupational History   Not on file  Tobacco Use   Smoking status: Former   Smokeless tobacco: Never  Vaping Use   Vaping status: Never Used  Substance and Sexual Activity   Alcohol use: Not Currently    Alcohol/week: 4.0 standard drinks of alcohol    Types: 4 Standard drinks or equivalent per week   Drug use: No   Sexual activity: Not Currently     Partners: Male    Birth control/protection: Abstinence  Other Topics Concern   Not on file  Social History Narrative   Not on file   Social Drivers of Health   Financial Resource Strain: Not on file  Food Insecurity: Not on file  Transportation Needs: Not on file  Physical Activity: Not on file  Stress: Not on file  Social Connections: Unknown (06/07/2022)   Received from [facility], [facility]   Social Network    Social Network: Not on file   Allergies  Allergen Reactions   Erythromycin Rash and Other (See Comments)   Family History  Problem Relation Age of Onset   Heart attack Father    Hypertension Father    Thyroid disease Mother        hypothyroid   Cancer Maternal Grandfather        colon cancer   Diabetes Maternal Grandmother        diet controlled   Breast cancer Neg Hx     Current Outpatient Medications (Endocrine & Metabolic):    norethindrone (JENCYCLA) 0.35 MG tablet, TAKE 1 TABLET DAILY    Current Outpatient Medications (Analgesics):    meloxicam (MOBIC) 15 MG tablet, Take 1 tablet (15 mg total) by mouth daily.   Current Outpatient Medications (Other):  Vitamin D, Ergocalciferol, (DRISDOL) 1.25 MG (50000 UNIT) CAPS capsule, Take 1 capsule (50,000 Units total) by mouth every 7 (seven) days.   Multiple Vitamins-Minerals (MULTI FOR HER) TABS, 1 tablet   valACYclovir (VALTREX) 500 MG tablet, Take 500 mg by mouth as needed.   Reviewed prior external information including notes and imaging from  primary care provider As well as notes that were available from care everywhere and other healthcare systems.  Past medical history, social, surgical and family history all reviewed in electronic medical record.  No pertanent information unless stated regarding to the chief complaint.   Review of Systems:  No headache, visual changes, nausea, vomiting, diarrhea, constipation, dizziness, abdominal pain, skin rash, fevers, chills, night sweats, weight  loss, swollen lymph nodes, body aches, joint swelling, chest pain, shortness of breath, mood changes. POSITIVE muscle aches  Objective  Blood pressure 116/82, pulse 60, height 5\' 8"  (1.727 m), weight 184 lb (83.5 kg), SpO2 98%.   General: No apparent distress alert and oriented x3 mood and affect normal, dressed appropriately.  HEENT: Pupils equal, extraocular movements intact  Respiratory: Patient's speak in full sentences and does not appear short of breath  Cardiovascular: No lower extremity edema, non tender, no erythema  Foot exam patient does have some hypertrophy of the distal aspect of the fifth metatarsal.  Patient is nontender though over there.  Patient's callus formation that she had previously is significantly smaller.  Patient is able to ambulate without any significant difficulty.    Limited muscular skeletal ultrasound was performed and interpreted by Antoine Primas, M  Limited ultrasound shows the patient does have a fairly significant callus formation noted.  Some increase in neovascularization over the bone itself.  No internal vascularity noted.  Seems to be more of a hard callus at this time. Impression: Callus formation over likely stress reaction of the distal fifth metatarsal    Impression and Recommendations:     The above documentation has been reviewed and is accurate and complete [name], DO

## 2023-03-27 ENCOUNTER — Other Ambulatory Visit: Payer: Self-pay

## 2023-03-27 ENCOUNTER — Encounter: Payer: Self-pay | Admitting: Family Medicine

## 2023-03-27 ENCOUNTER — Ambulatory Visit (INDEPENDENT_AMBULATORY_CARE_PROVIDER_SITE_OTHER): Payer: PRIVATE HEALTH INSURANCE | Admitting: Family Medicine

## 2023-03-27 VITALS — BP 116/82 | HR 60 | Ht 68.0 in | Wt 184.0 lb

## 2023-03-27 DIAGNOSIS — M79671 Pain in right foot: Secondary | ICD-10-CM | POA: Diagnosis not present

## 2023-03-27 DIAGNOSIS — S99921A Unspecified injury of right foot, initial encounter: Secondary | ICD-10-CM | POA: Diagnosis not present

## 2023-03-27 MED ORDER — VITAMIN D (ERGOCALCIFEROL) 1.25 MG (50000 UNIT) PO CAPS: 50000.0000 [IU] | ORAL_CAPSULE | ORAL | 0 refills | Status: DC

## 2023-03-27 NOTE — Assessment & Plan Note (Addendum)
Patient does have signs that is consistent with a stress fracture we anticipated.  Patient does have callus formation and should improve.  Worsening pain I would encourage MRI but I think patient is going to make significant difference.  Follow-up with me again in 6 to 8 weeks otherwise.  With a callus formation we will start once weekly vitamin D to help with the healing aspect.

## 2023-03-27 NOTE — Patient Instructions (Signed)
Vit D prescription In 2 weeks can start walking/jogging See you again in 5-6 weeks

## 2023-04-11 NOTE — Progress Notes (Signed)
53 y.o. [id number] Married Caucasian female here for annual exam.    Having spotting only.   Spotted last night and this am.   Will see PCP in April.  PCP: Sarah Has, MD   Patient's last menstrual period was 01/09/2023.           Sexually active: No.  The current method of family planning is abstinence-- stopped taking OCP three weeks ago.    Menopausal hormone therapy:  n/a Exercising: No.   Smoker:  former  OB History  Gravida Para Term Preterm AB Living  2    2 0  SAB IAB Ectopic Multiple Live Births   2       # Outcome Date GA Lbr Len/2nd Weight Sex Type Anes PTL Lv  2 IAB           1 IAB              HEALTH MAINTENANCE: Last 2 paps:  09/28/20 neg: HR HPV neg, 12/29/11 neg History of abnormal Pap or positive HPV:  no Mammogram:   07/28/22 - BI-RADS2, cat C - [facility], 07/09/21 Breast Density Cat C, BI-RADS CAT 1 neg Colonoscopy:  Dr.  Loreta Ave - 2021 - polyps Bone Density:  n/a  Result  n/a    There is no immunization history on file for this patient.    reports that she Lloyd quit smoking. She Lloyd never used smokeless tobacco. She reports that she does not currently use alcohol after a past usage of about 4.0 standard drinks of alcohol per week. She reports that she does not use drugs.  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 30 tablet 0   Multiple Vitamins-Minerals (MULTI FOR HER) TABS 1  tablet     valACYclovir (VALTREX) 500 MG tablet Take 500 mg by mouth as needed.     Vitamin D, Ergocalciferol, (DRISDOL) 1.25 MG (50000 UNIT) CAPS capsule Take 1 capsule (50,000 Units total) by mouth every 7 (seven) days. 12 capsule 0   No current facility-administered medications for this visit.    ALLERGIES: Erythromycin  Family History  Problem Relation Age of Onset   Heart attack Father    Hypertension Father    Thyroid disease Mother        hypothyroid   Cancer Maternal Grandfather        colon cancer   Diabetes Maternal Grandmother        diet controlled   Breast cancer Neg Hx     Review of Systems  All other systems reviewed and are negative.   PHYSICAL EXAM:  BP 122/72 (BP Location: Right Arm, Patient Position: Sitting, Cuff Size: Small)   Pulse 73   Ht 5' 9.5" (1.765 m)   Wt 185 lb (83.9 kg)   LMP 01/09/2023   SpO2 98%  BMI 26.93 kg/m     General appearance: alert, cooperative and appears stated age Head: normocephalic, without obvious abnormality, atraumatic Neck: no adenopathy, supple, symmetrical, trachea midline and thyroid normal to inspection and palpation Lungs: clear to auscultation bilaterally Breasts: normal appearance, no masses or tenderness, No nipple retraction or dimpling, No nipple discharge or bleeding, No axillary adenopathy Heart: regular rate and rhythm Abdomen: soft, non-tender; no masses, no organomegaly Extremities: extremities normal, atraumatic, no cyanosis or edema Skin: skin color, texture, turgor normal. No rashes or lesions Lymph nodes: cervical, supraclavicular, and axillary nodes normal. Neurologic: grossly normal  Pelvic: External genitalia:  no lesions              No abnormal inguinal nodes palpated.              Urethra:  normal appearing urethra with no masses, tenderness or lesions              Bartholins and Skenes: normal                 Vagina: normal appearing vagina with normal color and discharge, no lesions               Cervix: no lesions              Pap taken: No. Bimanual Exam:  Uterus:  normal size, contour, position, consistency, mobility, non-tender              Adnexa: no mass, fullness, tenderness              Rectal exam: Yes.  .  Confirms.              Anus:  normal sphincter tone, no lesions  Chaperone was present for exam:  Warren Lacy, CMA  ASSESSMENT: Well woman visit with gynecologic exam Likely perimenopausal. Hx stress fracture.   PLAN: Mammogram screening discussed. Self breast awareness reviewed. Pap and HRV collected:  No.  Due in 2027. Guidelines for Calcium, Vitamin D, regular exercise program including cardiovascular and weight bearing exercise. Medication refills:  NA Will check FSH, estradiol, and TSH. Routine labs with PCP. Follow up:  yearly and prn.

## 2023-04-24 ENCOUNTER — Encounter: Payer: Self-pay | Admitting: Family Medicine

## 2023-04-25 ENCOUNTER — Ambulatory Visit (INDEPENDENT_AMBULATORY_CARE_PROVIDER_SITE_OTHER): Payer: No Typology Code available for payment source | Admitting: Obstetrics and Gynecology

## 2023-04-25 ENCOUNTER — Encounter: Payer: Self-pay | Admitting: Obstetrics and Gynecology

## 2023-04-25 VITALS — BP 122/72 | HR 73 | Ht 69.5 in | Wt 185.0 lb

## 2023-04-25 DIAGNOSIS — N926 Irregular menstruation, unspecified: Secondary | ICD-10-CM

## 2023-04-25 DIAGNOSIS — Z01419 Encounter for gynecological examination (general) (routine) without abnormal findings: Secondary | ICD-10-CM

## 2023-04-25 NOTE — Patient Instructions (Signed)

## 2023-04-26 ENCOUNTER — Encounter: Payer: Self-pay | Admitting: Obstetrics and Gynecology

## 2023-04-26 LAB — ESTRADIOL: Estradiol: 34 pg/mL

## 2023-04-26 LAB — TSH: TSH: 4.24 m[IU]/L

## 2023-04-26 LAB — FOLLICLE STIMULATING HORMONE: FSH: 56.6 m[IU]/mL

## 2023-05-04 NOTE — Progress Notes (Signed)
 [facility] [address] [place] [address] Phone: [phone] Subjective:   ISusannah Gully, am serving as a scribe for Dr. Arthea Claudene.  I'm seeing this patient by the request  of:  Kip Righter, MD  CC: Right foot pain follow-up  YEP:Dlagzrupcz  03/27/2023 Patient does have signs that is consistent with a stress fracture we anticipated. Patient does have callus formation and should improve. Worsening pain I would encourage MRI but I think patient is going to make significant difference. Follow-up with me again in 6 to 8 weeks otherwise. With a callus formation we will start once weekly vitamin D  to help with the healing aspect.   Update 05/09/2023 ROSHAWNA Lloyd is a 53 y.o. female coming in with complaint of R foot pain. Reinjured foot 04/23/2023. Wore boot for 10 days. Patient states any type of touch still painful, but out of boot and walking okay.        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   Elevated serum creatinine    Herpes zoster    Infertility, female    Migraine, menstrual    Stress fracture    right foot   Past Surgical History:  Procedure Laterality Date   APPENDECTOMY  1986   dilatation and curettage  2010, 2011   ovarian egg retrieval  2013   pregenetic diagnostic  testing  2013   Social History   Socioeconomic History   Marital status: Married    Spouse name: Not on file   Number of children: Not on file   Years of education: Not on file   Highest education level: Not on file  Occupational History   Not on file  Tobacco Use   Smoking status: Former   Smokeless tobacco: Never  Vaping Use   Vaping status: Never Used  Substance and Sexual Activity   Alcohol use: Not Currently    Alcohol/week: 4.0 standard drinks of alcohol    Types: 4 Standard drinks or equivalent per week    Drug use: No   Sexual activity: Not Currently    Partners: Male    Birth control/protection: Abstinence  Other Topics Concern   Not on file  Social History Narrative   Not on file   Social Drivers of Health   Financial Resource Strain: Not on file  Food Insecurity: Not on file  Transportation Needs: Not on file  Physical Activity: Not on file  Stress: Not on file  Social Connections: Unknown (06/07/2022)   Received from [facility], [facility]   Social Network    Social Network: Not on file   Allergies  Allergen Reactions   Erythromycin Rash and Other (See Comments)   Family History  Problem Relation Age of Onset   Heart attack Father    Hypertension Father    Thyroid disease Mother        hypothyroid   Cancer Maternal Grandfather        colon cancer   Diabetes Maternal Grandmother        diet controlled   Breast cancer Neg Hx        Current Outpatient Medications (Analgesics):    meloxicam  (MOBIC ) 15 MG tablet, Take 1 tablet (15 mg total) by mouth daily.   Current Outpatient Medications (Other):    Multiple Vitamins-Minerals (MULTI FOR HER) TABS, 1 tablet  valACYclovir (VALTREX) 500 MG tablet, Take 500 mg by mouth as needed.   Vitamin D , Ergocalciferol , (DRISDOL ) 1.25 MG (50000 UNIT) CAPS capsule, Take 1 capsule (50,000 Units total) by mouth every 7 (seven) days.   Reviewed prior external information including notes and imaging from  primary care provider As well as notes that were available from care everywhere and other healthcare systems.  Past medical history, social, surgical and family history all reviewed in electronic medical record.  No pertanent information unless stated regarding to the chief complaint.   Review of Systems:  No headache, visual changes, nausea, vomiting, diarrhea, constipation, dizziness, abdominal pain, skin rash, fevers, chills, night sweats, weight loss, swollen lymph nodes, body aches, joint swelling, chest pain,  shortness of breath, mood changes. POSITIVE muscle aches  Objective  Blood pressure 112/80, pulse 80, height 5' 9 (1.753 m), weight 185 lb (83.9 kg), last menstrual period 01/09/2023, SpO2 97%.   General: No apparent distress alert and oriented x3 mood and affect normal, dressed appropriately.  HEENT: Pupils equal, extraocular movements intact  Respiratory: Patient's speak in full sentences and does not appear short of breath  Cardiovascular: No lower extremity edema, non tender, no erythema  Right foot exam does have some very mild pain over the PIP of the fifth metatarsal.  Neurovascularly intact distally.  No pain over the proximal MP joint.  Limited muscular skeletal ultrasound was performed and interpreted by CLAUDENE HUSSAR, M   Limited ultrasound shows the patient still has some soft tissue hypoechoic changes over the PIP joint of the fifth metatarsal.  Cortical irregularity noted that is still consistent with a healing stress reaction. Impression: Healing fifth metatarsal stress reaction   Impression and Recommendations:    The above documentation has been reviewed and is accurate and complete Colson Barco M Cassanda Walmer, DO

## 2023-05-09 ENCOUNTER — Ambulatory Visit (INDEPENDENT_AMBULATORY_CARE_PROVIDER_SITE_OTHER): Payer: PRIVATE HEALTH INSURANCE | Admitting: Family Medicine

## 2023-05-09 ENCOUNTER — Encounter: Payer: Self-pay | Admitting: Family Medicine

## 2023-05-09 ENCOUNTER — Other Ambulatory Visit: Payer: Self-pay

## 2023-05-09 VITALS — BP 112/80 | HR 80 | Ht 69.0 in | Wt 185.0 lb

## 2023-05-09 DIAGNOSIS — M79671 Pain in right foot: Secondary | ICD-10-CM | POA: Diagnosis not present

## 2023-05-09 DIAGNOSIS — S99921A Unspecified injury of right foot, initial encounter: Secondary | ICD-10-CM

## 2023-05-09 NOTE — Assessment & Plan Note (Signed)
 Seems to have some hypoechoic changes still on the soft tissue surrounding the area of the DIP.  Discussed icing regimen and home exercises, which activities to do and which ones to avoid.  Increase activity slowly.  Discussed icing regimen.  Discussed proper shoes.  Will have patient follow-up with me again in 4 weeks otherwise.

## 2023-05-09 NOTE — Patient Instructions (Signed)
Keep using Vit  Use rigid soled shoes See me in 4 weeks

## 2023-05-29 NOTE — Progress Notes (Signed)
 [name] [address] [place] [address] Phone: [phone] Subjective:   INadine Counts, am serving as a scribe for Dr. Antoine Primas.  I'm seeing this patient by the request  of:  Farris Has, MD  CC: Right foot pain  BJY:[id number]  05/09/2023 Seems to have some hypoechoic changes still on the soft tissue surrounding the area of the DIP.  Discussed icing regimen and home exercises, which activities to do and which ones to avoid.  Increase activity slowly.  Discussed icing regimen.  Discussed proper shoes.  Will have patient follow-up with me again in 4 weeks otherwise.      Update 06/07/2023 Sarah Lloyd is a 53 y.o. female coming in with complaint of R foot pain. Patient states 90% better. Gets a twinge every once aand a while that is short lived. Still tender to touch.      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   Elevated serum creatinine    Herpes zoster    Infertility, female    Migraine, menstrual    Stress fracture    right foot   Past Surgical History:  Procedure Laterality Date   APPENDECTOMY  1986   dilatation and curettage  2010, 2011   ovarian egg retrieval  2013   pregenetic diagnostic  testing  2013   Social History   Socioeconomic History   Marital status: Married    Spouse name: Not on file   Number of children: Not on file   Years of education: Not on file   Highest education level: Not on file  Occupational History   Not on file  Tobacco Use   Smoking status: Former   Smokeless tobacco: Never  Vaping Use   Vaping status: Never Used  Substance and Sexual Activity   Alcohol use: Not Currently    Alcohol/week: 4.0 standard drinks of alcohol    Types: 4 Standard drinks or equivalent per week   Drug use: No   Sexual activity: Not Currently    Partners: Male    Birth  control/protection: Abstinence  Other Topics Concern   Not on file  Social History Narrative   Not on file   Social Drivers of Health   Financial Resource Strain: Not on file  Food Insecurity: Not on file  Transportation Needs: Not on file  Physical Activity: Not on file  Stress: Not on file  Social Connections: Unknown (06/07/2022)   Received from [facility]   Social Network    Social Network: Not on file   Allergies  Allergen Reactions   Erythromycin Rash and Other (See Comments)   Family History  Problem Relation Age of Onset   Heart attack Father    Hypertension Father    Thyroid disease Mother        hypothyroid   Cancer Maternal Grandfather        colon cancer   Diabetes Maternal Grandmother        diet controlled   Breast cancer Neg Hx      Objective  Blood pressure 118/72, pulse 80, height 5\' 9"  (1.753 m), weight 187 lb (84.8 kg), SpO2 96%.   General: No apparent distress alert and oriented x3 mood and affect normal, dressed appropriately.  HEENT: Pupils equal, extraocular movements intact  Respiratory: Patient's speak in full sentences and  does not appear short of breath  Cardiovascular: No lower extremity edema, non tender, no erythema  Foot exam shows does have some still tenderness over the DIP of the fifth toe.  Patient does have significant decrease in swelling from previous exam.  Limited muscular skeletal ultrasound was performed and interpreted by Antoine Primas, M  Patient does have some neovascularization in the area than previous exam.  Seems to be more soft tissue than any cortical irregularity at the moment.  No significant swelling noted. Impression: Cortical irregularity noted.  Interval improvement   Impression and Recommendations:     The above documentation has been reviewed and is accurate and complete [name], DO

## 2023-05-30 ENCOUNTER — Other Ambulatory Visit: Payer: Self-pay | Admitting: Obstetrics and Gynecology

## 2023-05-30 NOTE — Telephone Encounter (Signed)
 Rx was previously denied stating patient is no longer taking this. Please deny again if appropriate Medication refill request: jencycla 0.35mg  Last AEX:  04-25-23 Next AEX: not scheduled Last MMG (if hormonal medication request): 07-09-21 birads 1:neg

## 2023-06-07 ENCOUNTER — Ambulatory Visit: Payer: PRIVATE HEALTH INSURANCE | Admitting: Family Medicine

## 2023-06-07 ENCOUNTER — Other Ambulatory Visit: Payer: Self-pay

## 2023-06-07 ENCOUNTER — Encounter: Payer: Self-pay | Admitting: Family Medicine

## 2023-06-07 VITALS — BP 118/72 | HR 80 | Ht 69.0 in | Wt 187.0 lb

## 2023-06-07 DIAGNOSIS — S99921A Unspecified injury of right foot, initial encounter: Secondary | ICD-10-CM

## 2023-06-07 DIAGNOSIS — M79671 Pain in right foot: Secondary | ICD-10-CM | POA: Diagnosis not present

## 2023-06-07 NOTE — Patient Instructions (Addendum)
 Running progression 1 min run 1 min walk totaling 30 mins 3x a week Increase by 1 min each week See you again in 5 weeks if not better

## 2023-06-07 NOTE — Assessment & Plan Note (Signed)
 Seems to be improving at this time.  90% better but still has some hypoechoic changes.  Patient is going to continue with some conservative therapy and I would like to see patient again in 4 weeks and if continuing to have difficulty do think that we do need to consider the possibility of injections secondary to the hypoechoic changes in the soft tissue changes noted

## 2023-06-30 NOTE — Progress Notes (Deleted)
 [name] [address] [place] [address] Phone: [phone] Subjective:    I'm seeing this patient by the request  of:  Farris Has, MD  CC:   BJY:NWGNFAOZHY  06/07/2023 Seems to be improving at this time.  90% better but still has some hypoechoic changes.  Patient is going to continue with some conservative therapy and I would like to see patient again in 4 weeks and if continuing to have difficulty do think that we do need to consider the possibility of injections secondary to the hypoechoic changes in the soft tissue changes noted   Updated 07/07/2023 Sarah Lloyd is a 53 y.o. 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   Elevated serum creatinine    Herpes zoster    Infertility, female    Migraine, menstrual    Stress fracture    right foot   Past Surgical History:  Procedure Laterality Date   APPENDECTOMY  1986   dilatation and curettage  2010, 2011   ovarian egg retrieval  2013   pregenetic diagnostic  testing  2013   Social History   Socioeconomic History   Marital status: Married    Spouse name: Not on file   Number of children: Not on file   Years of education: Not on file   Highest education level: Not on file  Occupational History   Not on file  Tobacco Use   Smoking status: Former   Smokeless tobacco: Never  Vaping Use   Vaping status: Never Used  Substance and Sexual Activity   Alcohol use: Not Currently    Alcohol/week: 4.0 standard drinks of alcohol    Types: 4 Standard drinks or equivalent per week   Drug use: No   Sexual activity: Not Currently    Partners: Male    Birth control/protection: Abstinence  Other Topics Concern   Not on file  Social History Narrative   Not on file   Social Drivers of Health    Financial Resource Strain: Not on file  Food Insecurity: Not on file  Transportation Needs: Not on file  Physical Activity: Not on file  Stress: Not on file  Social Connections: Unknown (06/07/2022)   Received from [facility]   Social Network    Social Network: Not on file   Allergies  Allergen Reactions   Erythromycin Rash and Other (See Comments)   Family History  Problem Relation Age of Onset   Heart attack Father    Hypertension Father    Thyroid disease Mother        hypothyroid   Cancer Maternal Grandfather        colon cancer   Diabetes Maternal Grandmother        diet controlled   Breast cancer Neg Hx        Current Outpatient Medications (Analgesics):    meloxicam (MOBIC) 15 MG tablet, Take 1 tablet (15 mg total) by mouth daily.   Current Outpatient Medications (Other):    Multiple Vitamins-Minerals (MULTI FOR HER) TABS, 1 tablet   valACYclovir (VALTREX) 500 MG tablet, Take 500 mg by mouth as needed.   Vitamin D, Ergocalciferol, (DRISDOL) 1.25 MG (50000 UNIT) CAPS capsule, Take 1 capsule (50,000 Units total) by mouth every 7 (seven)  days.   Reviewed prior external information including notes and imaging from  primary care provider As well as notes that were available from care everywhere and other healthcare systems.  Past medical history, social, surgical and family history all reviewed in electronic medical record.  No pertanent information unless stated regarding to the chief complaint.   Review of Systems:  No headache, visual changes, nausea, vomiting, diarrhea, constipation, dizziness, abdominal pain, skin rash, fevers, chills, night sweats, weight loss, swollen lymph nodes, body aches, joint swelling, chest pain, shortness of breath, mood changes. POSITIVE muscle aches  Objective  There were no vitals taken for this visit.   General: No apparent distress alert and oriented x3 mood and affect normal, dressed appropriately.   HEENT: Pupils equal, extraocular movements intact  Respiratory: Patient's speak in full sentences and does not appear short of breath  Cardiovascular: No lower extremity edema, non tender, no erythema      Impression and Recommendations:

## 2023-07-07 ENCOUNTER — Ambulatory Visit: Payer: PRIVATE HEALTH INSURANCE | Admitting: Family Medicine

## 2023-07-25 ENCOUNTER — Ambulatory Visit: Payer: PRIVATE HEALTH INSURANCE | Admitting: Internal Medicine

## 2023-08-01 ENCOUNTER — Encounter: Payer: Self-pay | Admitting: Internal Medicine

## 2023-08-01 ENCOUNTER — Ambulatory Visit (INDEPENDENT_AMBULATORY_CARE_PROVIDER_SITE_OTHER): Payer: PRIVATE HEALTH INSURANCE | Admitting: Internal Medicine

## 2023-08-01 VITALS — BP 114/80 | HR 64 | Temp 97.9°F | Ht 69.0 in | Wt 186.0 lb

## 2023-08-01 DIAGNOSIS — Z23 Encounter for immunization: Secondary | ICD-10-CM

## 2023-08-01 DIAGNOSIS — Z0000 Encounter for general adult medical examination without abnormal findings: Secondary | ICD-10-CM

## 2023-08-01 NOTE — Patient Instructions (Signed)
 We will get the records and have given you the shingles vaccine.

## 2023-08-01 NOTE — Progress Notes (Signed)
   Subjective:   Patient ID: Sarah Lloyd, female    DOB: [birth year], 53 y.o.   MRN: [id number]  HPI The patient is a new 53 YO female coming in for physical  PMH, [facility], social history reviewed and updated  Review of Systems  Constitutional: Negative.   HENT: Negative.    Eyes: Negative.   Respiratory:  Negative for cough, chest tightness and shortness of breath.   Cardiovascular:  Negative for chest pain, palpitations and leg swelling.  Gastrointestinal:  Negative for abdominal distention, abdominal pain, constipation, diarrhea, nausea and vomiting.  Musculoskeletal: Negative.   Skin: Negative.   Neurological: Negative.   Psychiatric/Behavioral: Negative.      Objective:  Physical Exam Constitutional:      Appearance: She is well-developed.  HENT:     Head: Normocephalic and atraumatic.  Cardiovascular:     Rate and Rhythm: Normal rate and regular rhythm.  Pulmonary:     Effort: Pulmonary effort is normal. No respiratory distress.     Breath sounds: Normal breath sounds. No wheezing or rales.  Abdominal:     General: Bowel sounds are normal. There is no distension.     Palpations: Abdomen is soft.     Tenderness: There is no abdominal tenderness. There is no rebound.  Musculoskeletal:     Cervical back: Normal range of motion.  Skin:    General: Skin is warm and dry.  Neurological:     Mental Status: She is alert and oriented to person, place, and time.     Coordination: Coordination normal.     Vitals:   08/01/23 0904  BP: 114/80  Pulse: 64  Temp: 97.9 F (36.6 C)  TempSrc: Oral  SpO2: 98%  Weight: 186 lb (84.4 kg)  Height: 5\' 9"  (1.753 m)    Assessment & Plan:  Shingrix IM given at visit

## 2023-08-01 NOTE — Assessment & Plan Note (Signed)
 Flu shot yearly counseled. Shingrix given 1st. Tetanus getting records for date. Colonoscopy getting records. Mammogram getting later this week, pap smear up to date. Counseled about sun safety and mole surveillance. Counseled about the dangers of distracted driving. Given 10 year screening recommendations.

## 2023-10-02 ENCOUNTER — Ambulatory Visit: Payer: PRIVATE HEALTH INSURANCE

## 2023-10-05 ENCOUNTER — Ambulatory Visit: Payer: PRIVATE HEALTH INSURANCE

## 2023-10-05 DIAGNOSIS — Z23 Encounter for immunization: Secondary | ICD-10-CM

## 2023-10-05 NOTE — Progress Notes (Signed)
 After obtaining consent, and per orders of Dr. Okey Dupre, injection of Shingle given by Ferdie Ping. Patient instructed to report any adverse reaction to me immediately.

## 2023-11-07 NOTE — Progress Notes (Deleted)
 [facility] [address] [place] [address] Phone: [phone] Subjective:    I'm seeing this patient by the request  of:  Rollene Almarie LABOR, MD  CC:   YEP:Dlagzrupcz  Sarah Lloyd is a 53 y.o. female coming in with complaint of L hamstring pain. Last seen in March for foot pain. 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   Elevated serum creatinine    Herpes zoster    Infertility, female    Migraine, menstrual    Stress fracture    right foot   Past Surgical History:  Procedure Laterality Date   APPENDECTOMY  1986   dilatation and curettage  2010, 2011   ovarian egg retrieval  2013   pregenetic diagnostic  testing  2013   Social History   Socioeconomic History   Marital status: Married    Spouse name: Not on file   Number of children: Not on file   Years of education: Not on file   Highest education level: Not on file  Occupational History   Not on file  Tobacco Use   Smoking status: Former   Smokeless tobacco: Never  Vaping Use   Vaping status: Never Used  Substance and Sexual Activity   Alcohol use: Not Currently    Alcohol/week: 4.0 standard drinks of alcohol    Types: 4 Standard drinks or equivalent per week   Drug use: No   Sexual activity: Not Currently    Partners: Male    Birth control/protection: Abstinence  Other Topics Concern   Not on file  Social History Narrative   Not on file   Social Drivers of Health   Financial Resource Strain: Not on file  Food Insecurity: Not on file  Transportation Needs: Not on file  Physical Activity: Not on file  Stress: Not on file  Social Connections: Unknown (06/07/2022)   Received from [facility]   Social Network    Social Network: Not on file   Allergies  Allergen Reactions   Erythromycin Rash and  Other (See Comments)   Family History  Problem Relation Age of Onset   Heart attack Father    Hypertension Father    Thyroid disease Mother        hypothyroid   Cancer Maternal Grandfather        colon cancer   Diabetes Maternal Grandmother        diet controlled   Breast cancer Neg Hx        Current Outpatient Medications (Analgesics):    meloxicam  (MOBIC ) 15 MG tablet, Take 1 tablet (15 mg total) by mouth daily. (Patient not taking: Reported on 08/01/2023)   Current Outpatient Medications (Other):    Multiple Vitamins-Minerals (MULTI FOR HER) TABS, 1 tablet   Reviewed prior external information including notes and imaging from  primary care provider As well as notes that were available from care everywhere and other healthcare systems.  Past medical history, social, surgical and family history all reviewed in electronic medical record.  No pertanent information unless stated regarding to the chief complaint.   Review of Systems:  No headache, visual changes, nausea, vomiting, diarrhea, constipation, dizziness, abdominal pain, skin rash, fevers, chills, night sweats, weight loss, swollen lymph nodes, body aches, joint swelling, chest pain, shortness of breath, mood changes.  POSITIVE muscle aches  Objective  There were no vitals taken for this visit.   General: No apparent distress alert and oriented x3 mood and affect normal, dressed appropriately.  HEENT: Pupils equal, extraocular movements intact  Respiratory: Patient's speak in full sentences and does not appear short of breath  Cardiovascular: No lower extremity edema, non tender, no erythema      Impression and Recommendations:

## 2023-11-09 ENCOUNTER — Ambulatory Visit: Payer: PRIVATE HEALTH INSURANCE | Admitting: Family Medicine

## 2024-04-12 ENCOUNTER — Ambulatory Visit: Payer: Self-pay

## 2024-04-12 NOTE — Telephone Encounter (Signed)
 FYI Only or Action Required?: Action required by provider: request for appointment.  Patient was last seen in primary care on 08/01/2023 by Rollene Almarie LABOR, MD.  Called Nurse Triage reporting Anxiety.  Symptoms began about a month ago.  Interventions attempted: Rest, hydration, or home remedies.  Symptoms are: gradually worsening. Anxiety gradual worsening. Still sleeping well. Stayed home from work today. Appointment made.  Triage Disposition: See PCP When Office is Open (Within 3 Days)  Patient/caregiver understands and will follow disposition?: Yes    Copied from CRM #[id number]. Topic: Clinical - Red Word Triage >> Apr 12, 2024  9:21 AM Willma SAUNDERS wrote: Red Word that prompted transfer to Nurse Triage: Patient would like to see the Dr today in regards to her mental health. Reason for Disposition  [1] Anxiety symptoms AND [2] has not been evaluated for this by doctor (or NP/PA)  Answer Assessment - Initial Assessment Questions 1. CONCERN: Did anything happen that prompted you to call today?      Anxiety  2. ANXIETY SYMPTOMS: Can you describe how you (your loved one; patient) have been feeling? (e.g., tense, restless, panicky, anxious, keyed up, overwhelmed, sense of impending doom).      overwhelmed 3. ONSET: How long have you been feeling this way? (e.g., hours, days, weeks)     1 month 4. SEVERITY: How would you rate the level of anxiety? (e.g., 0 - 10; or mild, moderate, severe).     severe 5. FUNCTIONAL IMPAIRMENT: How have these feelings affected your ability to do daily activities? Have you had more difficulty than usual doing your normal daily activities? (e.g., getting better, same, worse; self-care, school, work, interactions)     Sleeps well 6. HISTORY: Have you felt this way before? Have you ever been diagnosed with an anxiety problem in the past? (e.g., generalized anxiety disorder, panic attacks, PTSD). If Yes, ask: How was this problem treated?  (e.g., medicines, counseling, etc.)     yes 7. RISK OF HARM - SUICIDAL IDEATION: Do you ever have thoughts of hurting or killing yourself? If Yes, ask:  Do you have these feelings now? Do you have a plan on how you would do this?     no 8. TREATMENT:  What has been done so far to treat this anxiety? (e.g., medicines, relaxation strategies). What has helped?     Taking magnesium  9. THERAPIST: Do you have a counselor or therapist? If Yes, ask: What is their name?     o 10. POTENTIAL TRIGGERS: Do you drink caffeinated beverages (e.g., coffee, colas, teas), and how much daily? Do you drink alcohol or use any drugs? Have you started any new medicines recently?       no 11. PATIENT SUPPORT: Who is with you now? Who do you live with? Do you have family or friends who you can talk to?        husband 43. OTHER SYMPTOMS: Do you have any other symptoms? (e.g., feeling depressed, trouble concentrating, trouble sleeping, trouble breathing, palpitations or fast heartbeat, chest pain, sweating, nausea, or diarrhea)       tearful 13. PREGNANCY: Is there any chance you are pregnant? When was your last menstrual period?       no  Protocols used: Anxiety and Panic Attack-A-AH

## 2024-04-15 ENCOUNTER — Encounter: Payer: Self-pay | Admitting: Internal Medicine

## 2024-04-15 ENCOUNTER — Ambulatory Visit: Payer: PRIVATE HEALTH INSURANCE | Admitting: Internal Medicine

## 2024-04-15 VITALS — BP 133/88 | HR 54 | Temp 97.5°F | Ht 69.0 in | Wt 186.4 lb

## 2024-04-15 DIAGNOSIS — Z780 Asymptomatic menopausal state: Secondary | ICD-10-CM | POA: Insufficient documentation

## 2024-04-15 DIAGNOSIS — F4320 Adjustment disorder, unspecified: Secondary | ICD-10-CM | POA: Insufficient documentation

## 2024-04-15 DIAGNOSIS — F4322 Adjustment disorder with anxiety: Secondary | ICD-10-CM

## 2024-04-15 MED ORDER — BUSPIRONE HCL 5 MG PO TABS: 5.0000 mg | ORAL_TABLET | Freq: Two times a day (BID) | ORAL | 0 refills | Status: DC | PRN

## 2024-04-15 NOTE — Patient Instructions (Signed)
We have sent in buspar to use as needed for anxiety.

## 2024-04-15 NOTE — Assessment & Plan Note (Signed)
 She experiences mood changes and stress. Currently using ashwagandha and magnesium, which initially improved symptoms but have become less effective. Continue ashwagandha and magnesium as they are safe and may take 2-3 months for full effect. Monitor symptoms and consider discontinuing if no improvement after a few months

## 2024-04-15 NOTE — Assessment & Plan Note (Signed)
 She experiences anxiety due to stressors such as family dynamics and her son's ADHD. Prefers non-habit forming medications and behavioral therapy. Prescribed Buspar  as needed for anxiety, which is non-habit forming and non-drowsy. Encouraged to continue her son's behavioral therapy, which is beneficial for her. Discussed individual or family counseling to improve coping skills and communication.

## 2024-04-15 NOTE — Progress Notes (Signed)
 "  Subjective:   Patient ID: Sarah Lloyd, female    DOB: 1970-09-19, 54 y.o.   MRN: [id number]  Discussed the use of AI scribe software for clinical note transcription with the patient, who gave verbal consent to proceed.  History of Present Illness Sarah Lloyd is a 54 year old female who presents with emotional stress and coping difficulties related to family dynamics and her son's ADHD.  She experiences significant emotional stress and difficulty coping with family dynamics, particularly related to her five-year-old son who has ADHD. His medication is effective during the day, but his behavior becomes challenging once the medication wears off, leading to emotional dysregulation and hyperactivity. This has been a source of stress, especially when her husband struggles to manage her son's behavior in a calm manner.  She recounts a recent incident where her son became entangled in a seatbelt, which required cutting the seatbelt to free him. This event was emotionally taxing, leading to feelings of guilt and emotional distress. Her son quickly moved on from the incident, but she continued to feel emotional about it the following day.  She has been experiencing menopausal symptoms, which she believes may be contributing to her emotional stress. She has been taking supplements such as magnesium and [place] menopausal support, which initially seemed to help with her mood but have not maintained their effectiveness over time.  She has been in contact with her son's pediatrician to seek a referral for behavioral therapy for her son, with the hope that it will include family therapy to help improve her family dynamics and her coping strategies. She wants to improve her coping mechanisms and family interactions, noting that she used to be the 'calm and soothing' presence but feels this is wearing thin.     04/15/2024   10:42 AM  GAD 7 : Generalized Anxiety Score  Nervous, Anxious, on  Edge 1  Control/stop worrying 1  Worry too much - different things 1  Trouble relaxing 0  Restless 0  Easily annoyed or irritable 3  Afraid - awful might happen 0  Total GAD 7 Score 6  j   Review of Systems  Constitutional: Negative.   HENT: Negative.    Eyes: Negative.   Respiratory:  Negative for cough, chest tightness and shortness of breath.   Cardiovascular:  Negative for chest pain, palpitations and leg swelling.  Gastrointestinal:  Negative for abdominal distention, abdominal pain, constipation, diarrhea, nausea and vomiting.  Musculoskeletal: Negative.   Skin: Negative.   Neurological: Negative.   Psychiatric/Behavioral:  Positive for dysphoric mood. The patient is nervous/anxious.     Objective:  Physical Exam Constitutional:      Appearance: She is well-developed.  HENT:     Head: Normocephalic and atraumatic.  Cardiovascular:     Rate and Rhythm: Normal rate and regular rhythm.  Pulmonary:     Effort: Pulmonary effort is normal. No respiratory distress.     Breath sounds: Normal breath sounds. No wheezing or rales.  Abdominal:     General: Bowel sounds are normal. There is no distension.     Palpations: Abdomen is soft.     Tenderness: There is no abdominal tenderness.  Musculoskeletal:     Cervical back: Normal range of motion.  Skin:    General: Skin is warm and dry.  Neurological:     Mental Status: She is alert and oriented to person, place, and time.     Coordination: Coordination normal.     Vitals:  04/15/24 1035  BP: 133/88  Pulse: (!) 54  Temp: (!) 97.5 F (36.4 C)  SpO2: 99%  Weight: 186 lb 6.4 oz (84.6 kg)  Height: 5' 9 (1.753 m)    Assessment and Plan Assessment & Plan Adjustment disorder with anxiety   She experiences anxiety due to stressors such as family dynamics and her son's ADHD. Prefers non-habit forming medications and behavioral therapy. Prescribed Buspar  as needed for anxiety, which is non-habit forming and non-drowsy.  Encouraged to continue her son's behavioral therapy, which is beneficial for her. Discussed individual or family counseling to improve coping skills and communication.  Menopausal symptoms   She experiences mood changes and stress. Currently using ashwagandha and magnesium, which initially improved symptoms but have become less effective. Continue ashwagandha and magnesium as they are safe and may take 2-3 months for full effect. Monitor symptoms and consider discontinuing if no improvement after a few months.   "

## 2024-04-17 ENCOUNTER — Other Ambulatory Visit: Payer: Self-pay | Admitting: Internal Medicine

## 2024-04-17 NOTE — Telephone Encounter (Signed)
 Patient requesting medication Medication: busPIRone  (BUSPAR ) 5 MG tablet  ---appears to have a different pharmacy listed on this call: [id number] - [place], [place] - [address] [facility] [address] [place] [address] Phone: [phone] Fax: [phone]  Attempted to call patient to inquire about this request for a different pharmacy--on whether medication was unavailable at the original pharmacy that it was sent to or if there was another concern.  Patient did not answer the phone at this time. Left patient a voicemail requesting a call back to verify pharmacy.

## 2024-04-17 NOTE — Telephone Encounter (Signed)
 Copied from CRM [id number]. Topic: Clinical - Medication Refill >> Apr 17, 2024 10:42 AM Mercedes MATSU wrote: Medication: busPIRone  (BUSPAR ) 5 MG tablet  Has the patient contacted their pharmacy? Yes (Agent: If no, request that the patient contact the pharmacy for the refill. If patient does not wish to contact the pharmacy document the reason why and proceed with request.) (Agent: If yes, when and what did the pharmacy advise?)  This is the patient's preferred pharmacy:   [id number] - [place], [place] - [address] [facility] [address] [place] [address] Phone: [phone] Fax: [phone]  Is this the correct pharmacy for this prescription? Yes If no, delete pharmacy and type the correct one.   Has the prescription been filled recently? Yes  Is the patient out of the medication? Yes  Has the patient been seen for an appointment in the last year OR does the patient have an upcoming appointment? Yes  Can we respond through MyChart? Yes  Agent: Please be advised that Rx refills may take up to 3 business days. We ask that you follow-up with your pharmacy.

## 2024-04-17 NOTE — Telephone Encounter (Signed)
 Patient returned call stating that she requested that medication be sent to [facility],because her insurance cover at that pharmacy now and not [facility] any more.

## 2024-04-18 NOTE — Telephone Encounter (Unsigned)
 Copied from CRM [id number]. Topic: Clinical - Medication Question >> Apr 18, 2024  8:20 AM Berneda FALCON wrote: Reason for CRM: Patient calling to check on the status of her medication being sent to [facility] [place] [facility] of [facility] due to insurance no longer covering [facility]. I let her know the information was sent to PCP yesterday (1/14) and we are awaiting the response from PCP.  Please call patient with any updates when possible.  Patient callback is [phone] (home) [phone] (work)

## 2024-04-22 ENCOUNTER — Other Ambulatory Visit: Payer: Self-pay | Admitting: Internal Medicine

## 2024-04-22 MED ORDER — BUSPIRONE HCL 5 MG PO TABS: 5.0000 mg | ORAL_TABLET | Freq: Two times a day (BID) | ORAL | 0 refills | Status: AC | PRN

## 2024-11-06 ENCOUNTER — Ambulatory Visit: Admitting: Obstetrics and Gynecology
